# Patient Record
Sex: Female | Born: 1965 | Race: White | Hispanic: No | Marital: Married | Smoking: Never smoker
Health system: Southern US, Community
[De-identification: ages and names within clinical notes are randomized; demographics above are authoritative.]

## PROBLEM LIST (undated history)

## (undated) DIAGNOSIS — S42301A Unspecified fracture of shaft of humerus, right arm, initial encounter for closed fracture: Secondary | ICD-10-CM

## (undated) DIAGNOSIS — G43909 Migraine, unspecified, not intractable, without status migrainosus: Secondary | ICD-10-CM

## (undated) DIAGNOSIS — R140 Abdominal distension (gaseous): Secondary | ICD-10-CM

## (undated) DIAGNOSIS — R42 Dizziness and giddiness: Secondary | ICD-10-CM

## (undated) DIAGNOSIS — S42302A Unspecified fracture of shaft of humerus, left arm, initial encounter for closed fracture: Secondary | ICD-10-CM

## (undated) DIAGNOSIS — R12 Heartburn: Secondary | ICD-10-CM

## (undated) DIAGNOSIS — R109 Unspecified abdominal pain: Secondary | ICD-10-CM

## (undated) DIAGNOSIS — E739 Lactose intolerance, unspecified: Secondary | ICD-10-CM

## (undated) DIAGNOSIS — R630 Anorexia: Secondary | ICD-10-CM

## (undated) DIAGNOSIS — R011 Cardiac murmur, unspecified: Secondary | ICD-10-CM

## (undated) DIAGNOSIS — R110 Nausea: Secondary | ICD-10-CM

## (undated) HISTORY — DX: Nausea: R11.0

## (undated) HISTORY — DX: Anorexia: R63.0

## (undated) HISTORY — PX: CHOLECYSTECTOMY: SHX55

## (undated) HISTORY — DX: Lactose intolerance, unspecified: E73.9

## (undated) HISTORY — DX: Unspecified fracture of shaft of humerus, right arm, initial encounter for closed fracture: S42.301A

## (undated) HISTORY — DX: Cardiac murmur, unspecified: R01.1

## (undated) HISTORY — DX: Unspecified abdominal pain: R10.9

## (undated) HISTORY — PX: LAPAROSCOPY: SHX197

## (undated) HISTORY — DX: Heartburn: R12

## (undated) HISTORY — DX: Dizziness and giddiness: R42

## (undated) HISTORY — DX: Abdominal distension (gaseous): R14.0

## (undated) HISTORY — DX: Unspecified fracture of shaft of humerus, left arm, initial encounter for closed fracture: S42.302A

## (undated) HISTORY — PX: TONSILLECTOMY: SUR1361

## (undated) HISTORY — DX: Migraine, unspecified, not intractable, without status migrainosus: G43.909

## (undated) HISTORY — PX: BREAST BIOPSY: SHX20

---

## 1999-07-14 ENCOUNTER — Encounter: Payer: Self-pay | Admitting: Obstetrics and Gynecology

## 1999-07-14 ENCOUNTER — Encounter: Admission: RE | Admit: 1999-07-14 | Discharge: 1999-07-14 | Payer: Self-pay | Admitting: Obstetrics and Gynecology

## 2000-06-07 ENCOUNTER — Encounter (INDEPENDENT_AMBULATORY_CARE_PROVIDER_SITE_OTHER): Payer: Self-pay

## 2000-06-07 ENCOUNTER — Ambulatory Visit (HOSPITAL_COMMUNITY): Admission: RE | Admit: 2000-06-07 | Discharge: 2000-06-07 | Payer: Self-pay | Admitting: Obstetrics and Gynecology

## 2004-04-24 ENCOUNTER — Emergency Department: Payer: Self-pay | Admitting: Emergency Medicine

## 2008-06-05 ENCOUNTER — Encounter: Admission: RE | Admit: 2008-06-05 | Discharge: 2008-06-05 | Payer: Self-pay | Admitting: Obstetrics and Gynecology

## 2010-03-10 ENCOUNTER — Encounter
Admission: RE | Admit: 2010-03-10 | Discharge: 2010-03-10 | Payer: Self-pay | Source: Home / Self Care | Attending: Obstetrics and Gynecology | Admitting: Obstetrics and Gynecology

## 2010-08-12 NOTE — Op Note (Signed)
[facility]  Patient:    Monique Martin, Monique Martin                          MRN: [id number] Proc. Date: 06/07/00 Attending:  Katherine Roan, M.D.                           Operative Report  PREOPERATIVE DIAGNOSIS:       Pelvic pain and dysmenorrhea.  POSTOPERATIVE DIAGNOSIS:      Focal endometriosis of the pelvis.  OPERATIONS PERFORMED:         1. Laparoscopy with YAG laser fulguration of                                  endometriosis.                               2. Laser uterosacral nerve ablation procedure.                               3. Adhesiolysis.                               4. Hysteroscopy with dilation and curettage.  SURGEON:                      Katherine Roan, M.D.  DESCRIPTION OF PROCEDURE:     The patient was placed in the lithotomy position, prepped and draped in the usual fashion.  A Hulka elevator was inserted into the cervix.  A transverse incision was made in the abdomen and the abdomen was distended with carbon dioxide.  Aspiration infusion technique was utilized to be sure of proper needle placement.  The abdomen was the visualized through a 10/11 trocar in the umbilicus.  The left ovary appeared to be normal, somewhat cystic.  The right ovary was a streak ovary.  Both tubes were normal, somewhat thickened.  There was focal endometriosis anteriorly as well as posteriorly in the cul-de-sac and along the left pelvic side wall.  A second puncture was made for manipulation in the midline using a 5 mm trocar.  The YAG laser was then brought into view at 15 watts of power with a flat tip and the areas of endometriosis were ablated.  A LUNA procedure was performed.  No unusual blood loss occurred.  The adhesions of the colon over the left infundibulopelvic ligament were lysed, as well.  Anteriorly, I lysed the adhesions from the C-section along with focally ablating the endometriosis anteriorly.  No other abnormality was noted.  It should  be noted, at that time, we did do a chromotubation, and both tubes were widely patent to indigo carmine.  Gas was evacuated and the skin was closed with 0 Vicryl and 3-0 Vicryl.  Hemostasis was secured.  I went down below and carefully dilated the cervix and inserted a diagnostic hysteroscope.  The endometrial cavity appeared to be normal.  All the tissue that was removed was sent to the lab for study.  The patient tolerated this procedure well and was sent to the recovery room in good condition. DD:  06/07/00 TD:  06/07/00 Job: [id number]

## 2011-03-07 ENCOUNTER — Ambulatory Visit: Payer: Self-pay | Admitting: Family Medicine

## 2012-01-09 ENCOUNTER — Other Ambulatory Visit: Payer: Self-pay | Admitting: Obstetrics and Gynecology

## 2012-01-09 DIAGNOSIS — Z1231 Encounter for screening mammogram for malignant neoplasm of breast: Secondary | ICD-10-CM

## 2012-01-16 ENCOUNTER — Other Ambulatory Visit: Payer: Self-pay | Admitting: Gastroenterology

## 2012-01-16 ENCOUNTER — Ambulatory Visit
Admission: RE | Admit: 2012-01-16 | Discharge: 2012-01-16 | Disposition: A | Discharge status: Home / Self Care | Payer: 59 | Source: Ambulatory Visit | Attending: Obstetrics and Gynecology | Admitting: Obstetrics and Gynecology

## 2012-01-16 DIAGNOSIS — Z1231 Encounter for screening mammogram for malignant neoplasm of breast: Secondary | ICD-10-CM

## 2012-01-16 DIAGNOSIS — R1013 Epigastric pain: Secondary | ICD-10-CM

## 2012-01-18 ENCOUNTER — Other Ambulatory Visit: Payer: Self-pay | Admitting: Obstetrics and Gynecology

## 2012-01-18 DIAGNOSIS — R928 Other abnormal and inconclusive findings on diagnostic imaging of breast: Secondary | ICD-10-CM

## 2012-01-19 ENCOUNTER — Ambulatory Visit
Admission: RE | Admit: 2012-01-19 | Discharge: 2012-01-19 | Disposition: A | Discharge status: Home / Self Care | Payer: 59 | Source: Ambulatory Visit | Attending: Gastroenterology | Admitting: Gastroenterology

## 2012-01-19 DIAGNOSIS — R1013 Epigastric pain: Secondary | ICD-10-CM

## 2012-01-22 ENCOUNTER — Encounter (INDEPENDENT_AMBULATORY_CARE_PROVIDER_SITE_OTHER): Payer: Self-pay

## 2012-01-23 ENCOUNTER — Ambulatory Visit
Admission: RE | Admit: 2012-01-23 | Discharge: 2012-01-23 | Disposition: A | Discharge status: Home / Self Care | Payer: 59 | Source: Ambulatory Visit | Attending: Obstetrics and Gynecology | Admitting: Obstetrics and Gynecology

## 2012-01-23 DIAGNOSIS — R928 Other abnormal and inconclusive findings on diagnostic imaging of breast: Secondary | ICD-10-CM

## 2012-01-26 ENCOUNTER — Encounter (INDEPENDENT_AMBULATORY_CARE_PROVIDER_SITE_OTHER): Payer: Self-pay

## 2012-01-26 ENCOUNTER — Encounter (INDEPENDENT_AMBULATORY_CARE_PROVIDER_SITE_OTHER): Payer: Self-pay | Admitting: General Surgery

## 2012-01-26 ENCOUNTER — Ambulatory Visit (INDEPENDENT_AMBULATORY_CARE_PROVIDER_SITE_OTHER): Payer: 59 | Admitting: General Surgery

## 2012-01-26 VITALS — BP 120/84 | HR 77 | Temp 97.8°F | Ht 65.0 in | Wt 147.4 lb

## 2012-01-26 DIAGNOSIS — K8010 Calculus of gallbladder with chronic cholecystitis without obstruction: Secondary | ICD-10-CM

## 2012-01-26 DIAGNOSIS — K8066 Calculus of gallbladder and bile duct with acute and chronic cholecystitis without obstruction: Secondary | ICD-10-CM

## 2012-01-26 NOTE — Patient Instructions (Addendum)

## 2012-01-26 NOTE — Assessment & Plan Note (Signed)
Patient with classic symptoms of biliary colic and chronic cholecystitis. She has tenderness in her right upper quadrant and episodic colicky pain consistent with passing stones.  We will plan to do a laparoscopic cholecystectomy with intraoperative cholangiogram.  The surgical procedure was described to the patient in detail.  The patient was given Agricultural engineer. .  I discussed the incision type and location, the location of the gallbladder, the anatomy of the bile ducts and arteries, and the typical progression of surgery.  I discussed the possibility of converting to an open operation.  I advised of the risks of bleeding, infection, damage to other structures (such as the bile duct, intestine or liver), bile leak, need for other procedures or surgeries, and post op diarrhea/constipation.  We discussed the risk of blood clot.  We discussed the recovery period and post operative restrictions.  The patient was advised against taking blood thinners the week before surgery.

## 2012-01-26 NOTE — Progress Notes (Signed)
Chief Complaint  Patient presents with  . Pre-op Exam    eval gallbladder     HISTORY: Patient is a 46 year old female who presents with episodic epigastric pain.  Around 2-3 years ago she had around 8 or 9 months of severe pain. She was seen in urgent care and ruled out for cardiac issues.  No one ever figured out what her pain was and it gradually went away.  She did notice that it occurred after eating and that [place] q. And fried foods to make it worse. The severe attacks lasted around 15 minutes and went away on their own. This past summer she had them come on again and they continued to get a lot worse. She tried antacids which did not help. She tried H2 blockers which helped a little bit but did not relieve the pain. She got to where she was having pain with many different foods despite their fatty content or sugar content. She's lost 14 pounds in the past few weeks because of food avoidance. Labor Day with the last major major attack and she says she has not been able to see anything since that time of any significant quantity. All of the women on her father's side of family have had gallbladder disease.  Past Medical History  Diagnosis Date  . Heart murmur   . Migraines   . Anorexia   . Broken arms     both arms  . Dizziness   . Lactose intolerance   . Heartburn   . Abdominal pain   . Bloating   . Nausea     Past Surgical History  Procedure Date  . Laparoscopy   . Cesarean section 1992&1996    x 2  . Tonsillectomy     No current outpatient prescriptions on file.     No Known Allergies   Family History  Problem Relation Age of Onset  . Cancer Mother 61    bone  . Cancer Paternal Aunt     breast  . Cancer Paternal Uncle     bone   . Cancer Paternal Uncle     leukemia     History   Social History  . Marital Status: Married    Spouse Name: N/A    Number of Children: N/A  . Years of Education: N/A   Social History Main Topics  . Smoking status: Never  Smoker   . Smokeless tobacco: None  . Alcohol Use: No  . Drug Use: No  . Sexually Active:      REVIEW OF SYSTEMS - PERTINENT POSITIVES ONLY: 12 point review of systems negative other than HPI and PMH except for weight loss  EXAM: Filed Vitals:   01/26/12 0950  BP: 120/84  Pulse: 77  Temp: 97.8 F (36.6 C)    Gen:  No acute distress.  Well nourished and well groomed.   Neurological: Alert and oriented to person, place, and time. Coordination normal.  Head: Normocephalic and atraumatic.  Eyes: Conjunctivae are normal. Pupils are equal, round, and reactive to light. No scleral icterus.  Neck: Normal range of motion. Neck supple. No tracheal deviation or thyromegaly present.  Cardiovascular: Normal rate, regular rhythm, normal heart sounds and intact distal pulses.  Exam reveals no gallop and no friction rub.  No murmur heard. Respiratory: Effort normal.  No respiratory distress. No chest wall tenderness. Breath sounds normal.  No wheezes, rales or rhonchi.  GI: Soft. Bowel sounds are normal. The abdomen is soft.  There  is RUQ and epigastric tenderness.  There is no rebound and no guarding.  Musculoskeletal: Normal range of motion. Extremities are nontender.  Lymphadenopathy: No cervical, preauricular, postauricular or axillary adenopathy is present Skin: Skin is warm and dry. No rash noted. No diaphoresis. No erythema. No pallor. No clubbing, cyanosis, or edema.   Psychiatric: Normal mood and affect. Behavior is normal. Judgment and thought content normal.    LABORATORY RESULTS: Available labs are reviewed  AST slightly elevated at 54 Lipase normal.   RADIOLOGY RESULTS: See E-Chart or I-Site for most recent results.  Images and reports are reviewed. US IMPRESSION:  1. Multiple gallstones fill the gallbladder. No pain over  gallbladder.  2. No ductal dilatation.  3. The pancreas is obscured by bowel gas.    ASSESSMENT AND PLAN: Cholelithiasis with acute or chronic  cholecystitis Patient with classic symptoms of biliary colic and chronic cholecystitis. She has tenderness in her right upper quadrant and episodic colicky pain consistent with passing stones.  We will plan to do a laparoscopic cholecystectomy with intraoperative cholangiogram.  The surgical procedure was described to the patient in detail.  The patient was given Agricultural engineer. .  I discussed the incision type and location, the location of the gallbladder, the anatomy of the bile ducts and arteries, and the typical progression of surgery.  I discussed the possibility of converting to an open operation.  I advised of the risks of bleeding, infection, damage to other structures (such as the bile duct, intestine or liver), bile leak, need for other procedures or surgeries, and post op diarrhea/constipation.  We discussed the risk of blood clot.  We discussed the recovery period and post operative restrictions.  The patient was advised against taking blood thinners the week before surgery.          Maudry Diego MD Surgical Oncology, General and Endocrine Surgery [facility]      Visit Diagnoses: 1. Cholelithiasis with acute or chronic cholecystitis     Primary Care Physician: No primary provider on file.

## 2012-02-09 ENCOUNTER — Other Ambulatory Visit (INDEPENDENT_AMBULATORY_CARE_PROVIDER_SITE_OTHER): Payer: Self-pay | Admitting: General Surgery

## 2012-02-09 DIAGNOSIS — K8010 Calculus of gallbladder with chronic cholecystitis without obstruction: Secondary | ICD-10-CM

## 2012-02-09 DIAGNOSIS — K824 Cholesterolosis of gallbladder: Secondary | ICD-10-CM

## 2012-02-12 ENCOUNTER — Telehealth (INDEPENDENT_AMBULATORY_CARE_PROVIDER_SITE_OTHER): Payer: Self-pay

## 2012-02-12 NOTE — Telephone Encounter (Signed)
I called the pt to schedule her a postop appointment.  She is doing well.  I gave her an appointment for 11/25.  She asked when she can get in a bath.  She knows she can shower after 48 hours postop.  I told her to wait 3-4 weeks until after her incisions are healed because she puts them at risk for opening up.

## 2012-02-19 ENCOUNTER — Ambulatory Visit (INDEPENDENT_AMBULATORY_CARE_PROVIDER_SITE_OTHER): Payer: 59 | Admitting: General Surgery

## 2012-02-19 ENCOUNTER — Encounter (INDEPENDENT_AMBULATORY_CARE_PROVIDER_SITE_OTHER): Payer: Self-pay | Admitting: General Surgery

## 2012-02-19 VITALS — BP 132/72 | HR 69 | Temp 98.0°F | Resp 18 | Ht 65.0 in | Wt 150.0 lb

## 2012-02-19 DIAGNOSIS — K8010 Calculus of gallbladder with chronic cholecystitis without obstruction: Secondary | ICD-10-CM

## 2012-02-19 DIAGNOSIS — K8066 Calculus of gallbladder and bile duct with acute and chronic cholecystitis without obstruction: Secondary | ICD-10-CM

## 2012-02-20 NOTE — Assessment & Plan Note (Signed)
Pt feels much better since surgery.  No evidence of surgical complications.  Follow up PRN

## 2012-02-20 NOTE — Progress Notes (Signed)
HISTORY: Pt feels much better since having gallbladder removed.  She has no nausea or vomiting.  She denies fever or chills.  She denies diarrhea or constipation.  She is no longer taking narcotics.      EXAM: General:  Alert and oriented.  Well dressed.   Incision:  Healing well.     PATHOLOGY: Chronic cholecystitis and cholelithiasis.     ASSESSMENT AND PLAN:   Cholelithiasis with acute or chronic cholecystitis Pt feels much better since surgery.  No evidence of surgical complications.  Follow up PRN      Maudry Diego, MD Surgical Oncology, General & Endocrine Surgery [facility]  No primary provider on file. No ref. provider found

## 2012-03-07 ENCOUNTER — Ambulatory Visit: Payer: Self-pay | Admitting: Family Medicine

## 2012-04-11 ENCOUNTER — Encounter (INDEPENDENT_AMBULATORY_CARE_PROVIDER_SITE_OTHER): Payer: Self-pay

## 2013-02-05 ENCOUNTER — Other Ambulatory Visit: Payer: Self-pay

## 2013-02-05 DIAGNOSIS — Z1231 Encounter for screening mammogram for malignant neoplasm of breast: Secondary | ICD-10-CM

## 2013-03-10 ENCOUNTER — Ambulatory Visit
Admission: RE | Admit: 2013-03-10 | Discharge: 2013-03-10 | Disposition: A | Discharge status: Home / Self Care | Payer: 59 | Source: Ambulatory Visit

## 2013-03-10 DIAGNOSIS — Z1231 Encounter for screening mammogram for malignant neoplasm of breast: Secondary | ICD-10-CM

## 2014-02-23 ENCOUNTER — Other Ambulatory Visit: Payer: Self-pay

## 2014-02-23 DIAGNOSIS — Z1231 Encounter for screening mammogram for malignant neoplasm of breast: Secondary | ICD-10-CM

## 2014-03-13 ENCOUNTER — Ambulatory Visit
Admission: RE | Admit: 2014-03-13 | Discharge: 2014-03-13 | Disposition: A | Discharge status: Home / Self Care | Payer: 59 | Source: Ambulatory Visit

## 2014-03-13 DIAGNOSIS — Z1231 Encounter for screening mammogram for malignant neoplasm of breast: Secondary | ICD-10-CM

## 2015-08-26 ENCOUNTER — Other Ambulatory Visit: Payer: Self-pay | Admitting: Obstetrics and Gynecology

## 2015-08-26 DIAGNOSIS — Z1231 Encounter for screening mammogram for malignant neoplasm of breast: Secondary | ICD-10-CM

## 2015-09-13 ENCOUNTER — Ambulatory Visit
Admission: RE | Admit: 2015-09-13 | Discharge: 2015-09-13 | Disposition: A | Discharge status: Home / Self Care | Payer: 59 | Source: Ambulatory Visit | Attending: Obstetrics and Gynecology | Admitting: Obstetrics and Gynecology

## 2015-09-13 DIAGNOSIS — Z1231 Encounter for screening mammogram for malignant neoplasm of breast: Secondary | ICD-10-CM

## 2015-09-20 ENCOUNTER — Other Ambulatory Visit: Payer: Self-pay | Admitting: Obstetrics and Gynecology

## 2015-09-20 DIAGNOSIS — R928 Other abnormal and inconclusive findings on diagnostic imaging of breast: Secondary | ICD-10-CM

## 2015-09-21 ENCOUNTER — Ambulatory Visit
Admission: RE | Admit: 2015-09-21 | Discharge: 2015-09-21 | Disposition: A | Discharge status: Home / Self Care | Payer: 59 | Source: Ambulatory Visit | Attending: Obstetrics and Gynecology | Admitting: Obstetrics and Gynecology

## 2015-09-21 ENCOUNTER — Other Ambulatory Visit: Payer: Self-pay | Admitting: Obstetrics and Gynecology

## 2015-09-21 DIAGNOSIS — R928 Other abnormal and inconclusive findings on diagnostic imaging of breast: Secondary | ICD-10-CM

## 2015-09-23 ENCOUNTER — Other Ambulatory Visit: Payer: 59

## 2015-10-05 ENCOUNTER — Other Ambulatory Visit: Payer: Self-pay | Admitting: Obstetrics and Gynecology

## 2015-10-05 DIAGNOSIS — R928 Other abnormal and inconclusive findings on diagnostic imaging of breast: Secondary | ICD-10-CM

## 2015-10-06 ENCOUNTER — Ambulatory Visit
Admission: RE | Admit: 2015-10-06 | Discharge: 2015-10-06 | Disposition: A | Discharge status: Home / Self Care | Payer: 59 | Source: Ambulatory Visit | Attending: Obstetrics and Gynecology | Admitting: Obstetrics and Gynecology

## 2015-10-06 DIAGNOSIS — R928 Other abnormal and inconclusive findings on diagnostic imaging of breast: Secondary | ICD-10-CM

## 2015-10-06 HISTORY — PX: BREAST BIOPSY: SHX20

## 2016-05-11 ENCOUNTER — Other Ambulatory Visit: Payer: Self-pay | Admitting: Obstetrics and Gynecology

## 2016-05-11 DIAGNOSIS — N6310 Unspecified lump in the right breast, unspecified quadrant: Secondary | ICD-10-CM

## 2016-05-26 ENCOUNTER — Ambulatory Visit
Admission: RE | Admit: 2016-05-26 | Discharge: 2016-05-26 | Disposition: A | Discharge status: Home / Self Care | Payer: 59 | Source: Ambulatory Visit | Attending: Obstetrics and Gynecology | Admitting: Obstetrics and Gynecology

## 2016-05-26 DIAGNOSIS — N6310 Unspecified lump in the right breast, unspecified quadrant: Secondary | ICD-10-CM

## 2016-12-19 ENCOUNTER — Other Ambulatory Visit: Payer: Self-pay | Admitting: Obstetrics and Gynecology

## 2016-12-19 DIAGNOSIS — Z1231 Encounter for screening mammogram for malignant neoplasm of breast: Secondary | ICD-10-CM

## 2016-12-29 ENCOUNTER — Ambulatory Visit
Admission: RE | Admit: 2016-12-29 | Discharge: 2016-12-29 | Disposition: A | Discharge status: Home / Self Care | Payer: 59 | Source: Ambulatory Visit | Attending: Obstetrics and Gynecology | Admitting: Obstetrics and Gynecology

## 2016-12-29 DIAGNOSIS — Z1231 Encounter for screening mammogram for malignant neoplasm of breast: Secondary | ICD-10-CM

## 2018-01-15 ENCOUNTER — Other Ambulatory Visit: Payer: Self-pay | Admitting: Obstetrics and Gynecology

## 2018-01-15 DIAGNOSIS — Z1231 Encounter for screening mammogram for malignant neoplasm of breast: Secondary | ICD-10-CM

## 2018-01-25 ENCOUNTER — Ambulatory Visit: Payer: 59 | Admitting: Family Medicine

## 2018-01-25 ENCOUNTER — Encounter: Payer: Self-pay | Admitting: Family Medicine

## 2018-01-25 VITALS — BP 150/98 | HR 75 | Temp 98.2°F | Ht 65.0 in | Wt 179.4 lb

## 2018-01-25 DIAGNOSIS — Z780 Asymptomatic menopausal state: Secondary | ICD-10-CM

## 2018-01-25 DIAGNOSIS — E559 Vitamin D deficiency, unspecified: Secondary | ICD-10-CM

## 2018-01-25 DIAGNOSIS — R638 Other symptoms and signs concerning food and fluid intake: Secondary | ICD-10-CM | POA: Insufficient documentation

## 2018-01-25 DIAGNOSIS — Z1211 Encounter for screening for malignant neoplasm of colon: Secondary | ICD-10-CM

## 2018-01-25 DIAGNOSIS — Z8349 Family history of other endocrine, nutritional and metabolic diseases: Secondary | ICD-10-CM

## 2018-01-25 DIAGNOSIS — Z114 Encounter for screening for human immunodeficiency virus [HIV]: Secondary | ICD-10-CM | POA: Diagnosis not present

## 2018-01-25 DIAGNOSIS — E538 Deficiency of other specified B group vitamins: Secondary | ICD-10-CM

## 2018-01-25 DIAGNOSIS — J302 Other seasonal allergic rhinitis: Secondary | ICD-10-CM

## 2018-01-25 DIAGNOSIS — R030 Elevated blood-pressure reading, without diagnosis of hypertension: Secondary | ICD-10-CM

## 2018-01-25 DIAGNOSIS — M2550 Pain in unspecified joint: Secondary | ICD-10-CM

## 2018-01-25 LAB — CBC WITH DIFFERENTIAL/PLATELET
BASOS PCT: 1.2 % (ref 0.0–3.0)
Basophils Absolute: 0.1 10*3/uL (ref 0.0–0.1)
EOS PCT: 0.9 % (ref 0.0–5.0)
Eosinophils Absolute: 0.1 10*3/uL (ref 0.0–0.7)
HEMATOCRIT: 44.3 % (ref 36.0–46.0)
Hemoglobin: 14.9 g/dL (ref 12.0–15.0)
LYMPHS ABS: 2.5 10*3/uL (ref 0.7–4.0)
LYMPHS PCT: 38.8 % (ref 12.0–46.0)
MCHC: 33.7 g/dL (ref 30.0–36.0)
MCV: 93.9 fl (ref 78.0–100.0)
MONOS PCT: 5.4 % (ref 3.0–12.0)
Monocytes Absolute: 0.4 10*3/uL (ref 0.1–1.0)
NEUTROS PCT: 53.7 % (ref 43.0–77.0)
Neutro Abs: 3.5 10*3/uL (ref 1.4–7.7)
PLATELETS: 354 10*3/uL (ref 150.0–400.0)
RBC: 4.72 Mil/uL (ref 3.87–5.11)
RDW: 13.5 % (ref 11.5–15.5)
WBC: 6.5 10*3/uL (ref 4.0–10.5)

## 2018-01-25 LAB — FOLLICLE STIMULATING HORMONE: FSH: 79.1 m[IU]/mL

## 2018-01-25 LAB — COMPREHENSIVE METABOLIC PANEL
ALK PHOS: 102 U/L (ref 39–117)
ALT: 58 U/L — ABNORMAL HIGH (ref 0–35)
AST: 40 U/L — ABNORMAL HIGH (ref 0–37)
Albumin: 5.2 g/dL (ref 3.5–5.2)
BILIRUBIN TOTAL: 0.5 mg/dL (ref 0.2–1.2)
BUN: 15 mg/dL (ref 6–23)
CO2: 30 meq/L (ref 19–32)
Calcium: 10.5 mg/dL (ref 8.4–10.5)
Chloride: 99 mEq/L (ref 96–112)
Creatinine, Ser: 0.86 mg/dL (ref 0.40–1.20)
GFR: 73.26 mL/min (ref >60.00)
GLUCOSE: 100 mg/dL — AB (ref 70–99)
POTASSIUM: 4.3 meq/L (ref 3.5–5.1)
Sodium: 140 mEq/L (ref 135–145)
Total Protein: 8.1 g/dL (ref 6.0–8.3)

## 2018-01-25 LAB — LDL CHOLESTEROL, DIRECT: Direct LDL: 172 mg/dL

## 2018-01-25 LAB — LIPID PANEL
Cholesterol: 284 mg/dL — ABNORMAL HIGH (ref 0–200)
HDL: 68.2 mg/dL (ref >39.00)
NONHDL: 215.81
Total CHOL/HDL Ratio: 4
Triglycerides: 257 mg/dL — ABNORMAL HIGH (ref 0.0–149.0)
VLDL: 51.4 mg/dL — AB (ref 0.0–40.0)

## 2018-01-25 LAB — LUTEINIZING HORMONE: LH: 21.64 m[IU]/mL

## 2018-01-25 LAB — B12 AND FOLATE PANEL
FOLATE: 10 ng/mL (ref >5.9)
Vitamin B-12: 201 pg/mL — ABNORMAL LOW (ref 211–911)

## 2018-01-25 LAB — VITAMIN D 25 HYDROXY (VIT D DEFICIENCY, FRACTURES): VITD: 23.62 ng/mL — AB (ref 30.00–100.00)

## 2018-01-25 NOTE — Progress Notes (Addendum)
Subjective:    Patient ID: Monique Martin, female    DOB: [birth year], 52 y.o.   MRN: [id number]  HPI  Patient presents to clinic to establish with PCP.  Patient has not had a PCP in many years.  She does see GYN regularly for her woman's health exams, has upcoming mammogram planned for December 2019 and has upcoming GYN appointment for Pap smear.  Patient has never had colonoscopy, would prefer not to do colonoscopy but is interested in Cologuard.  Patient also reports concerns regarding inability to lose weight.  Patient states ever since she went through menopause at the age of 48, it has been more difficult to keep weight under control.  She has tried different diet plans.  Patient is concerned she possibly has a thyroid issue due to family history of thyroid problems.  Patients past medical, social, surgical and family history reviewed and updated in chart.  Due to patient having strong family history of heart disease, heart attack and diabetes she wants full blood panel drawn today.  Patient also notes her BP has been higher the past few times she has had it checked. States she has never had BP problems before.   Also c/o generalized aches and joint pains. Thinks it is arthritis.   Past Medical History:  Diagnosis Date  . Abdominal pain   . Anorexia   . Bloating   . Broken arms    both arms  . Dizziness   . Heart murmur   . Heartburn   . Lactose intolerance   . Migraines   . Migraines   . Nausea    Social History   Tobacco Use  . Smoking status: Never Smoker  . Smokeless tobacco: Never Used  Substance Use Topics  . Alcohol use: Never    Frequency: Never   Past Surgical History:  Procedure Laterality Date  . BREAST BIOPSY Right 10/06/2015  . BREAST BIOPSY    . CESAREAN SECTION  1992&1996   x 2  . CHOLECYSTECTOMY    . LAPAROSCOPY    . TONSILLECTOMY     Family History  Problem Relation Age of Onset  . Diabetes Father   . Hearing loss Father   . Hyperlipidemia  Father   . Hypertension Father   . Cancer Mother 33       bone  . Cancer Paternal Aunt        breast  . Breast cancer Paternal Aunt   . Cancer Paternal Uncle        bone   . Cancer Paternal Uncle        leukemia  . Arthritis Maternal Grandmother   . Hyperlipidemia Maternal Grandmother   . Hypertension Maternal Grandmother   . Stroke Maternal Grandmother   . Heart disease Maternal Grandfather   . Hyperlipidemia Maternal Grandfather   . Hypertension Maternal Grandfather   . Heart attack Maternal Grandfather   . Stroke Paternal Grandmother   . Hypertension Paternal Grandmother   . Hyperlipidemia Paternal Grandmother   . Heart disease Paternal Grandmother   . Heart attack Paternal Grandmother   . Heart disease Paternal Grandfather   . Hyperlipidemia Paternal Grandfather   . Hypertension Paternal Grandfather   . Heart attack Paternal Grandfather   . Hearing loss Paternal Grandfather    Review of Systems  Constitutional: Negative for chills, fatigue and fever.  HENT: Negative for congestion, ear pain, sinus pain and sore throat.   Eyes: Negative.   Respiratory: Negative for cough,  shortness of breath and wheezing.   Cardiovascular: Negative for chest pain, palpitations and leg swelling.  Gastrointestinal: Negative for abdominal pain, diarrhea, nausea and vomiting.  Genitourinary: Negative for dysuria, frequency and urgency.  Musculoskeletal: Negative for arthralgias and myalgias.  Skin: Negative for color change, pallor and rash.  Neurological: Negative for syncope, light-headedness and headaches.  Psychiatric/Behavioral: The patient is not nervous/anxious.       Objective:   Physical Exam  Constitutional: She is oriented to person, place, and time. No distress.  HENT:  Head: Normocephalic and atraumatic.  Eyes: EOM are normal. No scleral icterus.  Neck: Neck supple. No tracheal deviation present.  Cardiovascular: Normal rate, regular rhythm and normal heart sounds.  No  carotid bruit  Pulmonary/Chest: Effort normal and breath sounds normal. No respiratory distress.  Musculoskeletal: Normal range of motion. She exhibits no edema or deformity.  Neurological: She is alert and oriented to person, place, and time. No cranial nerve deficit.  Skin: Skin is warm and dry. She is not diaphoretic. No erythema. No pallor.  Psychiatric: She has a normal mood and affect. Her behavior is normal.  Nursing note and vitals reviewed.     Vitals:   01/25/18 0858  BP: (!) 150/98  Pulse: 75  Temp: 98.2 F (36.8 C)  SpO2: 96%   Assessment & Plan:   Unable to lose weight/postmenopausal - patient has had more difficult time losing weight since going through menopause.  We included female hormone levels and lab work to further investigate this.  Patient advised to start counting calories and keeping log of what she is eating to help keep track.  Patient advised to download lose it app, this happens very helpful with calorie counting and assisting people reach weight-loss goals.  Also encouraged regular physical activity.  Family history of metabolic/nutritional disorder -- CBC, CMP, lipid level and TSH, Vit D and B12/folic acid ordered and lab work.  HIV screening -HIV screening labs ordered today  Elevated blood pressure reading - patient will keep log of blood pressure at home for the next couple of weeks, when she returns to clinic in a couple weeks for follow-up we will recheck blood pressure.  If continues to trend high patient is aware that most likely we will start her on a low-dose blood pressure medication.  Colon cancer screening - Cologuard order placed for colon cancer screening.  Arthralgias -- Can try aleve BID as needed for joint pains. Also regular exercise can help reduce joint pains.    Follow-up in 2 to 3 weeks for monitoring of blood pressure.

## 2018-01-30 MED ORDER — VITAMIN D3 50 MCG (2000 UT) PO CAPS: 2000.0000 [IU] | ORAL_CAPSULE | Freq: Every day | ORAL | 0 refills | Status: AC

## 2018-01-30 MED ORDER — B-12 1000 MCG PO TABS: 1.0000 | ORAL_TABLET | Freq: Every day | ORAL | 0 refills | Status: AC

## 2018-01-30 NOTE — Addendum Note (Signed)
Addended by: Philis Nettle on: 01/30/2018 09:29 AM   Modules accepted: Orders

## 2018-01-31 LAB — THYROID PANEL WITH TSH
Free Thyroxine Index: 3 (ref 1.4–3.8)
T3 UPTAKE: 26 % (ref 22–35)
T4, Total: 11.5 ug/dL (ref 5.1–11.9)
TSH: 0.74 m[IU]/L

## 2018-01-31 LAB — HIV ANTIBODY (ROUTINE TESTING W REFLEX): HIV: NONREACTIVE

## 2018-01-31 LAB — PROGESTERONE

## 2018-01-31 LAB — ESTROGENS, TOTAL: Estrogen: 197 pg/mL

## 2018-02-08 ENCOUNTER — Ambulatory Visit: Payer: 59 | Admitting: Family Medicine

## 2018-02-15 ENCOUNTER — Encounter: Payer: Self-pay | Admitting: Family Medicine

## 2018-02-15 ENCOUNTER — Ambulatory Visit: Payer: 59 | Admitting: Family Medicine

## 2018-02-15 VITALS — BP 118/82 | HR 85 | Temp 98.2°F | Ht 65.0 in | Wt 175.0 lb

## 2018-02-15 DIAGNOSIS — R638 Other symptoms and signs concerning food and fluid intake: Secondary | ICD-10-CM | POA: Diagnosis not present

## 2018-02-15 DIAGNOSIS — E785 Hyperlipidemia, unspecified: Secondary | ICD-10-CM

## 2018-02-15 DIAGNOSIS — E559 Vitamin D deficiency, unspecified: Secondary | ICD-10-CM | POA: Diagnosis not present

## 2018-02-15 DIAGNOSIS — E538 Deficiency of other specified B group vitamins: Secondary | ICD-10-CM | POA: Diagnosis not present

## 2018-02-15 DIAGNOSIS — R030 Elevated blood-pressure reading, without diagnosis of hypertension: Secondary | ICD-10-CM

## 2018-02-15 NOTE — Addendum Note (Signed)
Addended by: Philis Nettle on: 02/15/2018 09:42 AM   Modules accepted: Level of Service

## 2018-02-15 NOTE — Progress Notes (Signed)
Subjective:    Patient ID: Monique Martin, female    DOB: 1965/11/27, 52 y.o.   MRN: [id number]  HPI  Presents to clinic to follow up on elevated BP readings, discuss elevated lipids. She has a stong family history of heart disease so we are monitoring closely.  Patient states her lab work and blood pressure reading last visit open her eyes to the fact that she really needs to start working much harder on diet and exercise.  She has cut back on her bread intake, limiting her salt, cut back on caffeine and sugar, has started drinking hibiscus tea rather than sweet tea.  She is also working on walking more and being more active.  Patient would like to avoid getting on medication for blood pressure and cholesterol if she can, but understands that if diet and exercise are not effective she may need medication in the future.  Patient has also started taking vitamin D supplement and vitamin B12 supplement since B12 and D deficiencies were discovered and lab work.  She is been tolerating these medications well.   Patient Active Problem List   Diagnosis Date Noted  . Unable to lose weight 01/25/2018  . Family history of metabolic and nutritional disorder 01/25/2018  . Post-menopausal 01/25/2018   Social History   Tobacco Use  . Smoking status: Never Smoker  . Smokeless tobacco: Never Used  Substance Use Topics  . Alcohol use: Never    Frequency: Never   Review of Systems  Constitutional: Negative for chills, fatigue and fever.  HENT: Negative for congestion, ear pain, sinus pain and sore throat.   Eyes: Negative.   Respiratory: Negative for cough, shortness of breath and wheezing.   Cardiovascular: Negative for chest pain, palpitations and leg swelling.  Gastrointestinal: Negative for abdominal pain, diarrhea, nausea and vomiting.  Genitourinary: Negative for dysuria, frequency and urgency.  Musculoskeletal: Negative for arthralgias and myalgias.  Skin: Negative for color change,  pallor and rash.  Neurological: Negative for syncope, light-headedness and headaches.  Psychiatric/Behavioral: The patient is not nervous/anxious.       Objective:   Physical Exam  Constitutional:  She appears well-developed and well-nourished. No distress.  HENT:  Head: Normocephalic and atraumatic.  Eyes: Pupils are equal, round, and reactive to light. EOM are normal. No scleral icterus.  Neck: Normal range of motion. Neck supple. No tracheal deviation present.  Cardiovascular: Normal rate, regular rhythm and normal heart sounds. No LE edema. No carotid bruit.  Pulmonary/Chest: Effort normal and breath sounds normal. No respiratory distress. She has no wheezes. She has no rales.  Neurological: She is alert and oriented to person, place, and time.  Gait normal  Skin: Skin is warm and dry. No pallor.  Psychiatric: She has a normal mood and affect. Her behavior is normal. Thought content normal.   Nursing note and vitals reviewed.    Vitals:   02/15/18 1305  BP: 118/82  Pulse: 85  Temp: 98.2 F (36.8 C)  SpO2: 97%   BP Readings from Last 3 Encounters:  02/15/18 118/82  01/25/18 (!) 150/98  02/19/12 132/72   Wt Readings from Last 3 Encounters:  02/15/18 175 lb (79.4 kg)  01/25/18 179 lb 6.4 oz (81.4 kg)  02/19/12 150 lb (68 kg)    Assessment & Plan:   Elevated BP without diagnosis of hypertension-patient blood pressure is much better today.  She will continue working on healthy diet regular exercise and monitor blood pressures at home.  Hyperlipidemia-patient would  prefer not to start medication at this time, she will continue working on her healthy diet exercise and will follow up with new cholesterol lab work in about 3 months.  Unable to lose weight-patient has been working hard on improving her diet choices and getting more active.  She has lost 4 pounds since last visit.  Vitamin D and B12 deficiency-patient is tolerating supplements very well.  We will recheck  vitamin D and B12 blood work at her next follow-up visit.  Follow-up here in approximately 3 months for management of chronic conditions continued monitoring of blood pressure, cholesterol and weight.  Patient aware she can return to clinic sooner if any issues arise.

## 2018-02-25 ENCOUNTER — Ambulatory Visit
Admission: RE | Admit: 2018-02-25 | Discharge: 2018-02-25 | Disposition: A | Discharge status: Home / Self Care | Payer: 59 | Source: Ambulatory Visit | Attending: Obstetrics and Gynecology | Admitting: Obstetrics and Gynecology

## 2018-02-25 DIAGNOSIS — Z1231 Encounter for screening mammogram for malignant neoplasm of breast: Secondary | ICD-10-CM

## 2018-04-02 ENCOUNTER — Ambulatory Visit: Payer: Self-pay | Admitting: *Deleted

## 2018-04-02 NOTE — Telephone Encounter (Signed)
Returned call to pt who states she removed a engorged tick from the top of her pubic hair on Monday but does not know how long the tick was attached.Pt states that the only way she need it was there is because she began to itch in that area. Pt states that she did walk in the woods around [place] time in the woods when the weather was warmer. Pt states she does not have a bulls eye rash to the area but states that the area is red like a mosquito bite. No other symptoms noted at this time. Pt given home care advice and advised to return call to the office with worsening symptoms. Pt verbalized understanding.  Reason for Disposition . Tick bite with no complications  Answer Assessment - Initial Assessment Questions 1. TYPE of TICK: "Is it a wood tick or a deer tick?" If unsure, ask: "What size was the tick?" "Did it look more like a watermelon seed or a poppy seed?"      Was engorged, 1/2 the size of watermelon seed  2. LOCATION: "Where is the tick bite located?"      Top of pubic hair 3. ONSET: "How long do you think the tick was attached before you removed it?" (Hours or days)      Was removed Monday, has no idea how long it was up there. Pt states that she started to itch which is how she knew it was there. Pt states she was walking in the woods around [place] time 4. TETANUS: "When was the last tetanus booster?"      No 5. PREGNANCY: "Is there any chance you are pregnant?" "When was your last menstrual period?"     No, no cycles since mid 40s  Protocols used: TICK BITE-A-AH

## 2018-05-24 ENCOUNTER — Ambulatory Visit: Payer: 59 | Admitting: Family Medicine

## 2018-06-14 ENCOUNTER — Ambulatory Visit: Payer: 59 | Admitting: Family Medicine

## 2018-07-23 ENCOUNTER — Ambulatory Visit: Payer: 59 | Admitting: Family Medicine

## 2018-12-30 ENCOUNTER — Other Ambulatory Visit: Payer: Self-pay | Admitting: Obstetrics and Gynecology

## 2018-12-30 DIAGNOSIS — Z1231 Encounter for screening mammogram for malignant neoplasm of breast: Secondary | ICD-10-CM

## 2019-02-28 ENCOUNTER — Other Ambulatory Visit: Payer: Self-pay

## 2019-02-28 ENCOUNTER — Ambulatory Visit
Admission: RE | Admit: 2019-02-28 | Discharge: 2019-02-28 | Disposition: A | Discharge status: Home / Self Care | Payer: 59 | Source: Ambulatory Visit | Attending: Obstetrics and Gynecology | Admitting: Obstetrics and Gynecology

## 2019-02-28 DIAGNOSIS — Z1231 Encounter for screening mammogram for malignant neoplasm of breast: Secondary | ICD-10-CM

## 2019-03-05 IMAGING — MG DIGITAL SCREENING BILATERAL MAMMOGRAM WITH TOMO AND CAD
4 series · 4 of 16 positions shown · non-contrast
Comparison: Previous exam(s).

CLINICAL DATA: Screening.

EXAM:
DIGITAL SCREENING BILATERAL MAMMOGRAM WITH TOMO AND CAD

[R CC synth-2D]
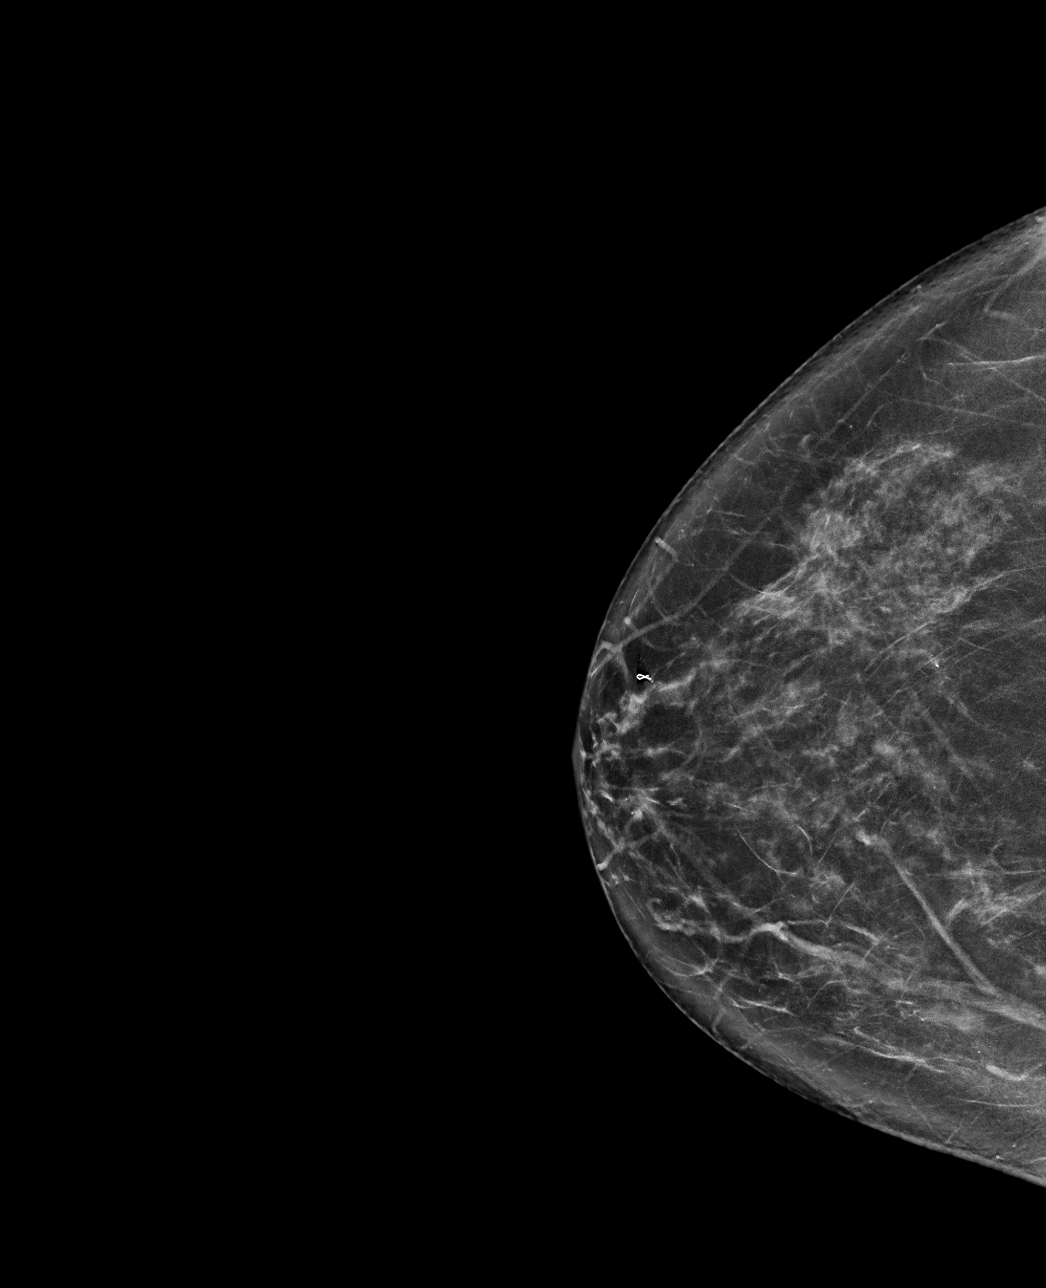

[L MLO tomo · tomo slice 42/83.0]
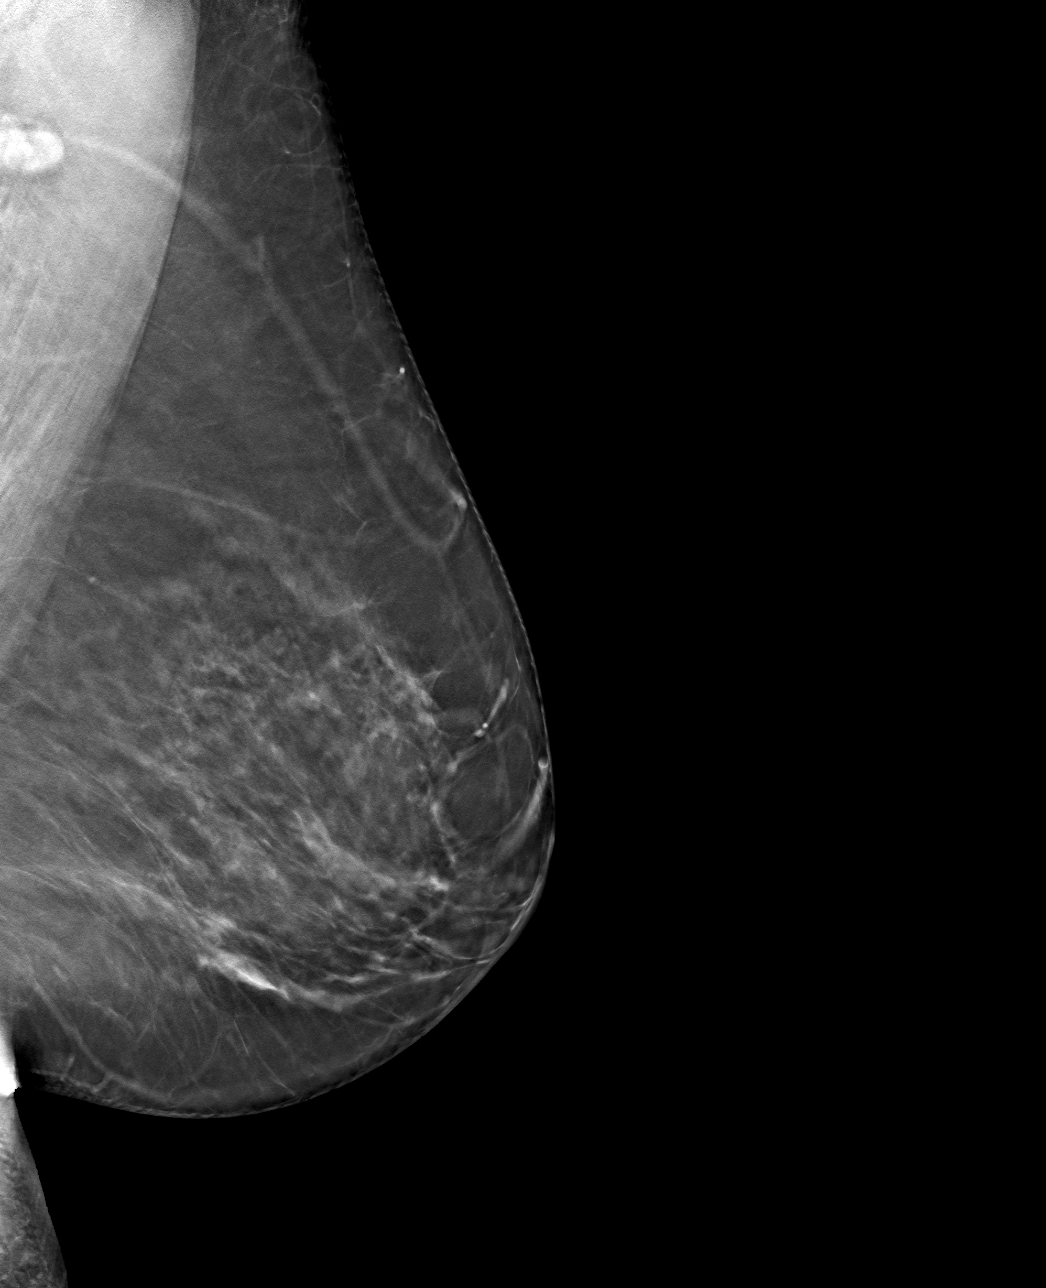

[R MLO tomo · tomo slice 42/83.0]
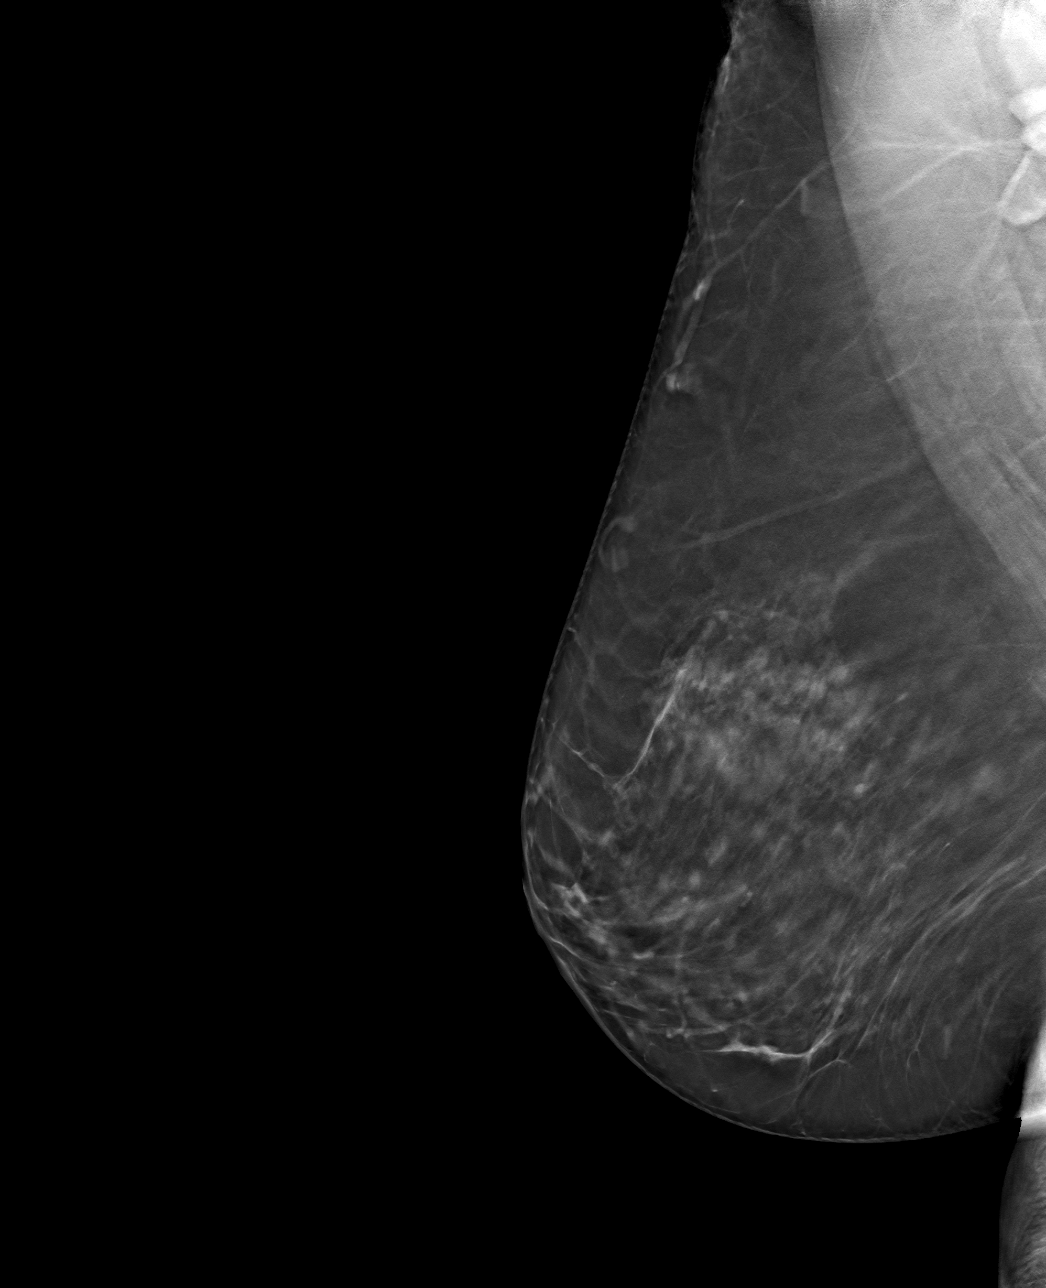

[L CC tomo · tomo slice 41/81.0]
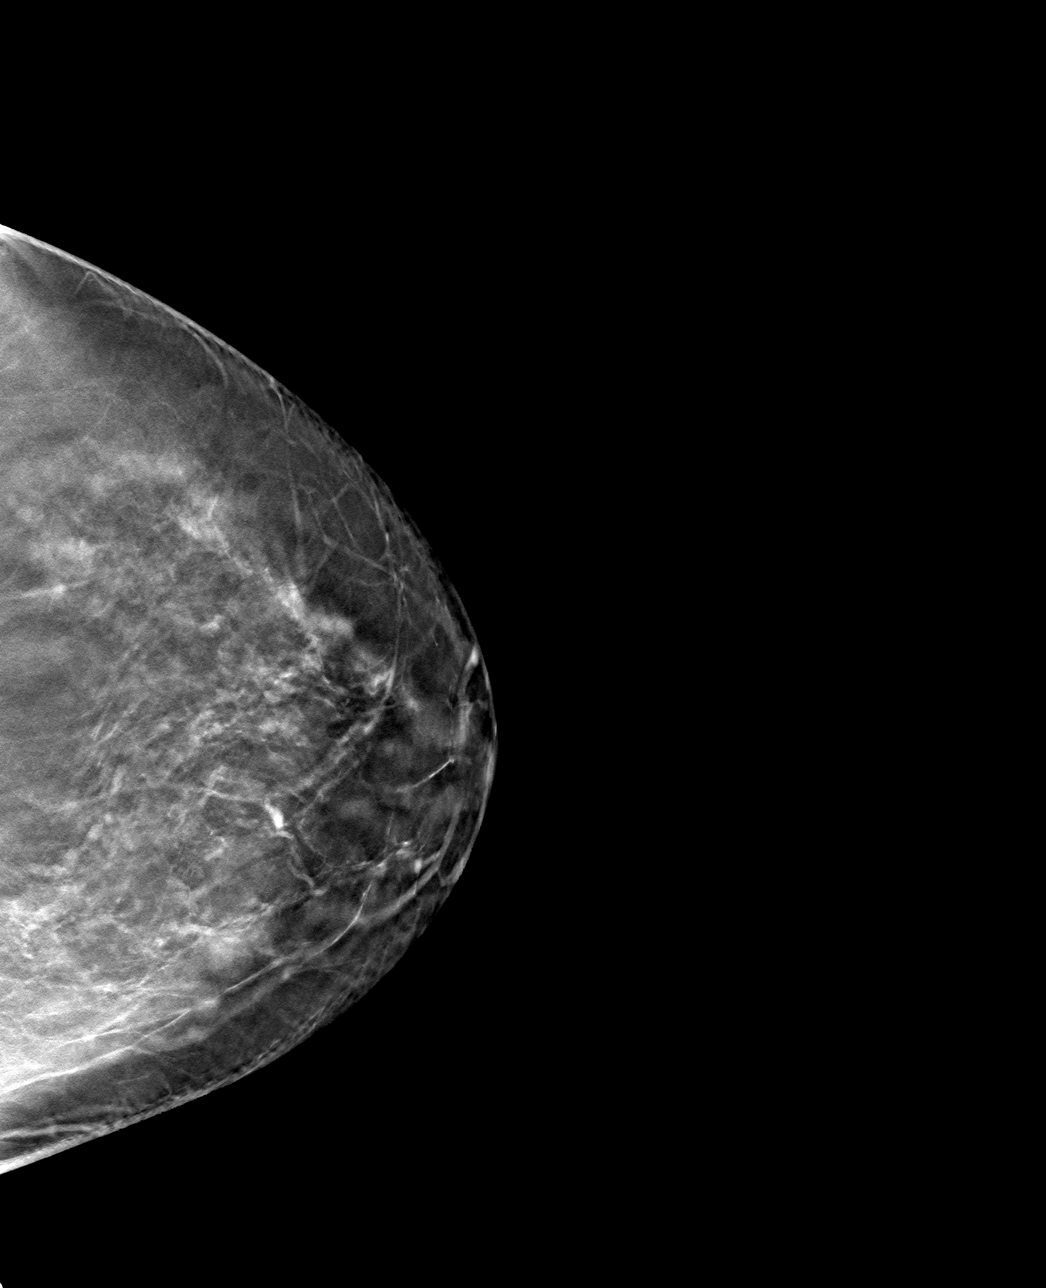

[4 of 16 positions shown; findings below may reference images not displayed]

ACR Breast Density Category b: There are scattered areas of
fibroglandular density.
FINDINGS: There are no findings suspicious for malignancy. Images were
processed with CAD.
IMPRESSION: No mammographic evidence of malignancy. A result letter of this
screening mammogram will be mailed directly to the patient.

RECOMMENDATION:
Screening mammogram in one year. (Code:[id number])

BI-RADS CATEGORY  1: Negative.

## 2020-02-23 ENCOUNTER — Other Ambulatory Visit: Payer: Self-pay | Admitting: Obstetrics and Gynecology

## 2020-02-23 DIAGNOSIS — Z1231 Encounter for screening mammogram for malignant neoplasm of breast: Secondary | ICD-10-CM

## 2020-03-03 ENCOUNTER — Other Ambulatory Visit: Payer: Self-pay

## 2020-03-03 ENCOUNTER — Ambulatory Visit
Admission: RE | Admit: 2020-03-03 | Discharge: 2020-03-03 | Disposition: A | Discharge status: Home / Self Care | Payer: 59 | Source: Ambulatory Visit | Attending: Obstetrics and Gynecology | Admitting: Obstetrics and Gynecology

## 2020-03-03 DIAGNOSIS — Z1231 Encounter for screening mammogram for malignant neoplasm of breast: Secondary | ICD-10-CM

## 2020-03-11 ENCOUNTER — Encounter: Payer: Self-pay | Admitting: Family Medicine

## 2020-03-11 ENCOUNTER — Other Ambulatory Visit: Payer: Self-pay

## 2020-03-11 ENCOUNTER — Ambulatory Visit (INDEPENDENT_AMBULATORY_CARE_PROVIDER_SITE_OTHER): Payer: 59 | Admitting: Family Medicine

## 2020-03-11 VITALS — BP 118/80 | HR 73 | Temp 98.0°F | Ht 65.75 in | Wt 176.4 lb

## 2020-03-11 DIAGNOSIS — R1013 Epigastric pain: Secondary | ICD-10-CM

## 2020-03-11 DIAGNOSIS — Z1211 Encounter for screening for malignant neoplasm of colon: Secondary | ICD-10-CM

## 2020-03-11 DIAGNOSIS — R109 Unspecified abdominal pain: Secondary | ICD-10-CM

## 2020-03-11 DIAGNOSIS — E860 Dehydration: Secondary | ICD-10-CM

## 2020-03-11 LAB — CBC WITH DIFFERENTIAL/PLATELET
Basophils Absolute: 0 10*3/uL (ref 0.0–0.1)
Basophils Relative: 0.4 % (ref 0.0–3.0)
Eosinophils Absolute: 0.1 10*3/uL (ref 0.0–0.7)
Eosinophils Relative: 1 % (ref 0.0–5.0)
HCT: 43.1 % (ref 36.0–46.0)
Hemoglobin: 14.8 g/dL (ref 12.0–15.0)
Lymphocytes Relative: 40.8 % (ref 12.0–46.0)
Lymphs Abs: 2.9 10*3/uL (ref 0.7–4.0)
MCHC: 34.3 g/dL (ref 30.0–36.0)
MCV: 91.8 fl (ref 78.0–100.0)
Monocytes Absolute: 0.4 10*3/uL (ref 0.1–1.0)
Monocytes Relative: 5.2 % (ref 3.0–12.0)
Neutro Abs: 3.7 10*3/uL (ref 1.4–7.7)
Neutrophils Relative %: 52.6 % (ref 43.0–77.0)
Platelets: 319 10*3/uL (ref 150.0–400.0)
RBC: 4.69 Mil/uL (ref 3.87–5.11)
RDW: 13.3 % (ref 11.5–15.5)
WBC: 7 10*3/uL (ref 4.0–10.5)

## 2020-03-11 LAB — BASIC METABOLIC PANEL
BUN: 13 mg/dL (ref 6–23)
CO2: 28 mEq/L (ref 19–32)
Calcium: 10.1 mg/dL (ref 8.4–10.5)
Chloride: 102 mEq/L (ref 96–112)
Creatinine, Ser: 0.91 mg/dL (ref 0.40–1.20)
GFR: 71.04 mL/min (ref >60.00)
Glucose, Bld: 97 mg/dL (ref 70–99)
Potassium: 4.4 mEq/L (ref 3.5–5.1)
Sodium: 139 mEq/L (ref 135–145)

## 2020-03-11 LAB — POCT URINALYSIS DIP (CLINITEK)
Bilirubin, UA: NEGATIVE
Blood, UA: NEGATIVE
Glucose, UA: 100 mg/dL — AB
Nitrite, UA: NEGATIVE
Spec Grav, UA: >=1.03 — AB (ref 1.010–1.025)
Urobilinogen, UA: 0.2 E.U./dL
pH, UA: 6 (ref 5.0–8.0)

## 2020-03-11 LAB — URINALYSIS, MICROSCOPIC ONLY

## 2020-03-11 LAB — HEPATIC FUNCTION PANEL
ALT: 42 U/L — ABNORMAL HIGH (ref 0–35)
AST: 29 U/L (ref 0–37)
Albumin: 5 g/dL (ref 3.5–5.2)
Alkaline Phosphatase: 97 U/L (ref 39–117)
Bilirubin, Direct: 0.1 mg/dL (ref 0.0–0.3)
Total Bilirubin: 0.4 mg/dL (ref 0.2–1.2)
Total Protein: 8.3 g/dL (ref 6.0–8.3)

## 2020-03-11 LAB — H. PYLORI ANTIBODY, IGG: H Pylori IgG: NEGATIVE

## 2020-03-11 LAB — LIPASE: Lipase: 17 U/L (ref 11.0–59.0)

## 2020-03-11 NOTE — Progress Notes (Addendum)
Monique Basnett T. Vianna Venezia, MD, [place] at [facility] [name], [address]  Phone: [phone]  FAX: [phone]  Monique Martin - 54 y.o. female  MRN [id number]  Date of Birth: 1966-03-18  Date: 03/11/2020  PCP: Monique Green, FNP  Referral: Monique Green, FNP  Chief Complaint  Patient presents with  . Abdominal Pain    Started Friday and progressively got worse. Stabbing pain that radiates from L side of back to L rib cage. Nauseated when she eats.      This visit occurred during the SARS-CoV-2 public health emergency.  Safety protocols were in place, including screening questions prior to the visit, additional usage of staff PPE, and extensive cleaning of exam room while observing appropriate contact time as indicated for disinfecting solutions.   Subjective:   Monique Martin is a 54 y.o. very pleasant female patient with Body mass index is 28.69 kg/m. who presents with the following:  Started last Friday and started as a dull pain in the left side.  Now will be in the lower L abdomen.  Will having a stabbing pain.  Has had some other issues in the last three or four months.   She is a very nice young lady at age 82.  She has been having some issues off and on with her upper and left upper abdomen for approximately 6 months.  This is been off and on however since Friday this is been relatively worse compared to her prior episodes.  She is having some issues and pain underneath her rib cage and to a lesser extent on the flank.  She has not had any trauma whatsoever.  She has not lost any weight.  She has not had any bright red blood per rectum or melena.  She is eating without any nausea, vomiting or diarrhea.  She does have a history and is status post cholecystectomy as well as laparoscopy for endometriosis and C-section x2.  She denies any right lower quadrant tenderness or  right upper quadrant tenderness.  To a lesser extent she does have some epigastric tenderness.  Wt Readings from Last 3 Encounters:  03/11/20 176 lb 6.4 oz (80 kg)  02/15/18 175 lb (79.4 kg)  01/25/18 179 lb 6.4 oz (81.4 kg)     Review of Systems is noted in the HPI, as appropriate  Patient Active Problem List   Diagnosis Date Noted  . Hyperlipidemia 02/15/2018  . B12 deficiency 02/15/2018  . Vitamin D deficiency 02/15/2018  . Elevated BP without diagnosis of hypertension 02/15/2018  . Unable to lose weight 01/25/2018  . Family history of metabolic and nutritional disorder 01/25/2018  . Post-menopausal 01/25/2018    Past Medical History:  Diagnosis Date  . Abdominal pain   . Anorexia   . Bloating   . Broken arms    both arms  . Dizziness   . Heart murmur   . Heartburn   . Lactose intolerance   . Migraines   . Migraines   . Nausea     Past Surgical History:  Procedure Laterality Date  . BREAST BIOPSY Right 10/06/2015  . BREAST BIOPSY    . CESAREAN SECTION  1992&1996   x 2  . CHOLECYSTECTOMY    . LAPAROSCOPY    . TONSILLECTOMY      Family History  Problem Relation Age of Onset  . Diabetes Father   .  Hearing loss Father   . Hyperlipidemia Father   . Hypertension Father   . Cancer Mother 54       bone  . Cancer Paternal Aunt        breast  . Breast cancer Paternal Aunt   . Cancer Paternal Uncle        bone   . Cancer Paternal Uncle        leukemia  . Arthritis Maternal Grandmother   . Hyperlipidemia Maternal Grandmother   . Hypertension Maternal Grandmother   . Stroke Maternal Grandmother   . Heart disease Maternal Grandfather   . Hyperlipidemia Maternal Grandfather   . Hypertension Maternal Grandfather   . Heart attack Maternal Grandfather   . Stroke Paternal Grandmother   . Hypertension Paternal Grandmother   . Hyperlipidemia Paternal Grandmother   . Heart disease Paternal Grandmother   . Heart attack Paternal Grandmother   . Heart disease  Paternal Grandfather   . Hyperlipidemia Paternal Grandfather   . Hypertension Paternal Grandfather   . Heart attack Paternal Grandfather   . Hearing loss Paternal Grandfather      Objective:   BP 118/80 (BP Location: Left Arm, Patient Position: Sitting)   Pulse 73   Temp 98 F (36.7 C)   Ht 5' 5.75" (1.67 m)   Wt 176 lb 6.4 oz (80 kg)   SpO2 98%   BMI 28.69 kg/m   GEN: no acute distress. HEENT: Atraumatic, Normocephalic.  Ears and Nose: No external deformity. CV: RRR, No M/G/R. No JVD. No thrill. No extra heart sounds. PULM: CTA B, no wheezes, crackles, rhonchi. No retractions. No resp. distress. No accessory muscle use. ABD: S, ND, +BS. No rebound. No HSM.  Underneath the left rib cage patient is tender to palpation.  She also has some mild tenderness in the epigastric region and to a much lesser extent the left lower quadrant.    She does not have any CVA tenderness.  Full rib exam is done and she does not have any rib tenderness or tenderness with compression of the ribs.  Resisted abdominal concentric and eccentric lifts of rectus as well as oblique musculature causes no discomfort.  She does not have any rebound tenderness.  There is no fluid wave.  EXTR: No c/c/e PSYCH: Normally interactive. Conversant.    Laboratory and Imaging Data: Results for orders placed or performed in visit on 03/11/20  CBC with Differential/Platelet  Result Value Ref Range   WBC 7.0 4.0 - 10.5 K/uL   RBC 4.69 3.87 - 5.11 Mil/uL   Hemoglobin 14.8 12.0 - 15.0 g/dL   HCT 43.1 36.0 - 46.0 %   MCV 91.8 78.0 - 100.0 fl   MCHC 34.3 30.0 - 36.0 g/dL   RDW 13.3 11.5 - 15.5 %   Platelets 319.0 150.0 - 400.0 K/uL   Neutrophils Relative % 52.6 43.0 - 77.0 %   Lymphocytes Relative 40.8 12.0 - 46.0 %   Monocytes Relative 5.2 3.0 - 12.0 %   Eosinophils Relative 1.0 0.0 - 5.0 %   Basophils Relative 0.4 0.0 - 3.0 %   Neutro Abs 3.7 1.4 - 7.7 K/uL   Lymphs Abs 2.9 0.7 - 4.0 K/uL   Monocytes  Absolute 0.4 0.1 - 1.0 K/uL   Eosinophils Absolute 0.1 0.0 - 0.7 K/uL   Basophils Absolute 0.0 0.0 - 0.1 K/uL  Basic metabolic panel  Result Value Ref Range   Sodium 139 135 - 145 mEq/L   Potassium 4.4 3.5 -  5.1 mEq/L   Chloride 102 96 - 112 mEq/L   CO2 28 19 - 32 mEq/L   Glucose, Bld 97 70 - 99 mg/dL   BUN 13 6 - 23 mg/dL   Creatinine, Ser 0.91 0.40 - 1.20 mg/dL   GFR 71.04 >60.00 mL/min   Calcium 10.1 8.4 - 10.5 mg/dL  Hepatic function panel  Result Value Ref Range   Total Bilirubin 0.4 0.2 - 1.2 mg/dL   Bilirubin, Direct 0.1 0.0 - 0.3 mg/dL   Alkaline Phosphatase 97 39 - 117 U/L   AST 29 0 - 37 U/L   ALT 42 (H) 0 - 35 U/L   Total Protein 8.3 6.0 - 8.3 g/dL   Albumin 5.0 3.5 - 5.2 g/dL  H. pylori antibody, IgG  Result Value Ref Range   H Pylori IgG Negative Negative  Lipase  Result Value Ref Range   Lipase 17.0 11.0 - 59.0 U/L  Urine Microscopic  Result Value Ref Range   WBC, UA 3-6/hpf (A) 0-2/hpf   RBC / HPF 0-2/hpf 0-2/hpf   Mucus, UA Presence of (A) None   Squamous Epithelial / LPF Rare(0-4/hpf) Rare(0-4/hpf)  POCT URINALYSIS DIP (CLINITEK)  Result Value Ref Range   Color, UA yellow yellow   Clarity, UA clear clear   Glucose, UA =100 (A) negative mg/dL   Bilirubin, UA negative negative   Ketones, POC UA trace (5) (A) negative mg/dL   Spec Grav, UA >=1.030 (A) 1.010 - 1.025   Blood, UA negative negative   pH, UA 6.0 5.0 - 8.0   POC PROTEIN,UA trace negative, trace   Urobilinogen, UA 0.2 0.2 or 1.0 E.U./dL   Nitrite, UA Negative Negative   Leukocytes, UA Small (1+) (A) Negative     Assessment and Plan:     ICD-10-CM   1. Left lateral abdominal pain  R10.9 CBC with Differential/Platelet    Basic metabolic panel    Hepatic function panel    H. pylori antibody, IgG    Lipase    CT Abdomen Pelvis W Contrast    POCT URINALYSIS DIP (CLINITEK)    Urine Microscopic    Urine Culture    Ambulatory referral to Gastroenterology  2. Epigastric abdominal pain   R10.13 CBC with Differential/Platelet    Basic metabolic panel    Hepatic function panel    H. pylori antibody, IgG    Lipase    CT Abdomen Pelvis W Contrast    Ambulatory referral to Gastroenterology  3. Dehydration  E86.0 Ambulatory referral to Gastroenterology  4. Colon cancer screening  Z12.11 Ambulatory referral to Gastroenterology   Left lateral abdominal pain x6 months with some worsening since Friday.  She is able to eat and drink without much significant impairment.  She is dehydrated currently and does have ketones in her urine.  She also has a small number of white blood cells, so we will do a urine culture as well.  All of her lab work has returned and is grossly unremarkable and is reassuring.  This is not musculoskeletal pain.  Given 33-month in length with several exacerbations worse now compared to prior, obtain a CT of the abdomen and pelvis with contrast to evaluate for potential intra-abdominal pathology, potential renal pathology, possible diverticulitis, and although much less likely, neoplasm cannot fully be excluded.  I have asked our clinical care coordinators to help do this by Friday.  Addendum: 03/12/20 1:50 PM CT is reassuring and labwork is reassuring. +WBC on micro with LE on  UA Etiology is unclear.  I am going to treat with augmentin.  Colitis / diverticulitis not seen on CT.  Possible but less likely.  She has never had a colonoscopy.  I think it is most appropriate for her to see GI.  CT Abdomen Pelvis W Contrast  Result Date: 03/12/2020 CLINICAL DATA:  Left-sided abdominal pain and nausea EXAM: CT ABDOMEN AND PELVIS WITH CONTRAST TECHNIQUE: Multidetector CT imaging of the abdomen and pelvis was performed using the standard protocol following bolus administration of intravenous contrast. Oral contrast also administered. CONTRAST:  188mL OMNIPAQUE IOHEXOL 300 MG/ML  SOLN COMPARISON:  March 07, 2011 FINDINGS: Lower chest: Lung bases are clear.  There is a  small hiatal hernia. Hepatobiliary: There is hepatic steatosis. No focal liver lesions are evident. The gallbladder is absent. There is no appreciable biliary duct dilatation. Pancreas: There is no pancreatic mass or inflammatory focus. Spleen: No splenic lesions are evident. Adrenals/Urinary Tract: Adrenals bilaterally appear normal. There is a parapelvic cyst in the left kidney measuring 1.8 x 1.3 cm. There is no hydronephrosis on either side. There is a 1 mm calculus in the mid right kidney. There is no appreciable ureteral calculus on either side. The urinary bladder is midline with wall thickness within normal limits. Stomach/Bowel: There is moderate stool in the colon. There is no appreciable bowel wall or mesenteric thickening. The terminal ileum appears unremarkable. No bowel obstruction. No evident free air or portal venous air. Vascular/Lymphatic: No abdominal aortic aneurysm. No arterial vascular lesions are evident. Major venous structures appear patent. There is no evident adenopathy in the abdomen or pelvis. Reproductive: The uterus is anteverted. There is no evident adnexal mass. Other: Diminutive appendix without inflammation. No abscess or ascites evident in the abdomen or pelvis. Musculoskeletal: No blastic or lytic bone lesions. No intramuscular or abdominal wall lesions. IMPRESSION: 1. No bowel wall thickening or bowel obstruction. No abscess in the abdomen or pelvis. No appreciable diverticular disease. No periappendiceal region inflammation. 2. Nonobstructing 1 mm calculus mid right kidney. No hydronephrosis or ureteral calculus on either side. Urinary bladder wall thickness normal. 3.  Small hiatal hernia. 4.  Hepatic steatosis. 5.  Gallbladder absent. Electronically Signed   By: Lowella Grip III M.D.   On: 03/12/2020 11:37    Orders Placed This Encounter  Procedures  . Urine Culture  . CT Abdomen Pelvis W Contrast  . CBC with Differential/Platelet  . Basic metabolic panel  .  Hepatic function panel  . H. pylori antibody, IgG  . Lipase  . Urine Microscopic  . Ambulatory referral to Gastroenterology  . POCT URINALYSIS DIP (CLINITEK)    Follow-up: No follow-ups on file.  Signed,  Maud Deed. Norris Bodley, MD   Outpatient Encounter Medications as of 03/11/2020  Medication Sig  . Cholecalciferol (VITAMIN D3) 50 MCG (2000 UT) capsule Take 1 capsule (2,000 Units total) by mouth daily.  Marland Kitchen dextromethorphan-guaiFENesin (MUCINEX DM) 30-600 MG 12hr tablet Take 1 tablet by mouth 2 (two) times daily.  . fluticasone (FLONASE) 50 MCG/ACT nasal spray Place into both nostrils daily.  Marland Kitchen amoxicillin-clavulanate (AUGMENTIN) 875-125 MG tablet Take 1 tablet by mouth 2 (two) times daily for 10 days.  . Cyanocobalamin (B-12) 1000 MCG TABS Take 1 tablet by mouth daily. (Patient not taking: Reported on 03/11/2020)  . naproxen sodium (ALEVE) 220 MG tablet Take 220 mg by mouth. (Patient not taking: Reported on 03/11/2020)   No facility-administered encounter medications on file as of 03/11/2020.

## 2020-03-12 ENCOUNTER — Telehealth: Payer: Self-pay

## 2020-03-12 ENCOUNTER — Ambulatory Visit
Admission: RE | Admit: 2020-03-12 | Discharge: 2020-03-12 | Disposition: A | Discharge status: Home / Self Care | Payer: 59 | Source: Ambulatory Visit | Attending: Family Medicine | Admitting: Family Medicine

## 2020-03-12 DIAGNOSIS — R109 Unspecified abdominal pain: Secondary | ICD-10-CM | POA: Insufficient documentation

## 2020-03-12 DIAGNOSIS — R1013 Epigastric pain: Secondary | ICD-10-CM | POA: Diagnosis present

## 2020-03-12 LAB — URINE CULTURE
MICRO NUMBER:: 11326694
SPECIMEN QUALITY:: ADEQUATE

## 2020-03-12 MED ORDER — IOHEXOL 300 MG/ML  SOLN
100.0000 mL | Freq: Once | INTRAMUSCULAR | Status: AC | PRN
  Administered 2020-03-12: 11:00:00 100 mL via INTRAVENOUS

## 2020-03-12 MED ORDER — AMOXICILLIN-POT CLAVULANATE 875-125 MG PO TABS: 1.0000 | ORAL_TABLET | Freq: Two times a day (BID) | ORAL | 0 refills | Status: AC

## 2020-03-12 NOTE — Telephone Encounter (Signed)
[place] pt....   [facility] CT called with call report CT scan, results in chart... please advise

## 2020-03-12 NOTE — Addendum Note (Signed)
Addended by: Owens Loffler on: 03/12/2020 01:50 PM   Modules accepted: Orders

## 2020-03-12 NOTE — Telephone Encounter (Signed)
I will address myself.

## 2020-03-15 ENCOUNTER — Encounter: Payer: Self-pay | Admitting: Nurse Practitioner

## 2020-04-02 ENCOUNTER — Encounter: Payer: Self-pay | Admitting: Nurse Practitioner

## 2020-04-02 ENCOUNTER — Ambulatory Visit: Payer: 59 | Admitting: Nurse Practitioner

## 2020-04-02 VITALS — BP 110/74 | HR 72 | Ht 66.0 in | Wt 178.0 lb

## 2020-04-02 DIAGNOSIS — Z1211 Encounter for screening for malignant neoplasm of colon: Secondary | ICD-10-CM | POA: Diagnosis not present

## 2020-04-02 DIAGNOSIS — R109 Unspecified abdominal pain: Secondary | ICD-10-CM

## 2020-04-02 MED ORDER — SUTAB 1479-225-188 MG PO TABS: 1.0000 | ORAL_TABLET | ORAL | 0 refills | Status: DC

## 2020-04-02 NOTE — Progress Notes (Signed)
ASSESSMENT AND PLAN    # 55 yo female with pain located on her left side, just below rib cage. It started in her back as a very intermittent pain 6 months ago. She has localized tenderness of her left side ( not LUQ) just below rib cage. Pain not affected by physical activity nor is it affected by eating or bowel movements. Patient does not seem to be GI related. CT scan and labs unremarkable. Fortunately, for whatever reason she hasn't had any pain since Christmas.   # Colon cancer screening. Patient has never had a colonoscopy. No alarm features such as bleeding or anemia. No [place] of colon cancer. Patient is interested in having a screening colonoscopy.  --Patient will be scheduled for a colonoscopy. The risks and benefits of colonoscopy with possible polypectomy / biopsies were discussed and the patient agrees to proceed.   # Fatty liver by CTscan. Liver chemistries normal. Explained that weight loss is part of management of fatty liver disease   HISTORY OF PRESENT ILLNESS     Primary Gastroenterologist : new - [place] Cellar, MD  Chief Complaint : abdominal pain   Monique Martin is a 55 y.o. female with PMH / [place] significant for,  but not necessarily limited to: endometriosis, C-section x2, pancreatitis, cholecystectomy  Patient saw PCP mid December for left sided abdominal pain. She says the pain started in her left back several months prior and was very intermittent. Then,  in December the pain began radiating from her back around to her left side just below bottom rib. She says the pain did not radiate into the abdominal area but it felt like when she had pancreatitis years ago. The pain was a persistent stabbing pain and she had associated nausea. The pain wasn't related to eating or BMs and it wasn't related to physical activity. She saw PCP who ran tests. Her CBC was fine. Except for minimally elevated ALT of 42 her liver tests were normal. Lipase normal. H.pylori IgG  negative. UA showed small number of WBC, sent for culture.  Treated empirically with Augmentin to cover for UTI and or colitis / diverticulitis.  CT scan w/ contrast on 03/12/20 negative for any acute findings but showed nonobstructing right kidney stone, small hiatal hernia, hepatic steatosis. No abnormalities of the spleen. Urine culture results suggested contamination but she was advised to complete Augmentin. PCP didn't feel pain was musculoskeletal in nature. She hasn't had any further pain since Christmas.   Aleve on home med list but she hasn't taken it in a year as it elevated her blood pressure.   Data Reviewed: December 2021 CBC -ok Except for minimally elevated ALT of 42 her liver tests were normal.  Lipase normal.  H.pylori IgG negative.  UA showed small number of WBC so sent for culture but returned as contaminated.    Previous Endoscopic Evaluations / Pertinent Studies:  none  Past Medical History:  Diagnosis Date  . Anorexia   . Broken arms    both arms  . Heart murmur   . Heartburn   . Lactose intolerance   . Migraines      Past Surgical History:  Procedure Laterality Date  . BREAST BIOPSY Right 10/06/2015  . BREAST BIOPSY    . CESAREAN SECTION  1992&1996   x 2  . CHOLECYSTECTOMY    . LAPAROSCOPY    . TONSILLECTOMY     Family History  Problem Relation Age of Onset  . Diabetes Father   .  Hearing loss Father   . Hyperlipidemia Father   . Hypertension Father   . Cancer Mother 70       bone  . Cancer Paternal Aunt        breast  . Breast cancer Paternal Aunt   . Cancer Paternal Uncle        bone   . Cancer Paternal Uncle        leukemia  . Arthritis Maternal Grandmother   . Hyperlipidemia Maternal Grandmother   . Hypertension Maternal Grandmother   . Stroke Maternal Grandmother   . Heart disease Maternal Grandfather   . Hyperlipidemia Maternal Grandfather   . Hypertension Maternal Grandfather   . Heart attack Maternal Grandfather   . Stroke  Paternal Grandmother   . Hypertension Paternal Grandmother   . Hyperlipidemia Paternal Grandmother   . Heart disease Paternal Grandmother   . Heart attack Paternal Grandmother   . Heart disease Paternal Grandfather   . Hyperlipidemia Paternal Grandfather   . Hypertension Paternal Grandfather   . Heart attack Paternal Grandfather   . Hearing loss Paternal Grandfather    Social History   Tobacco Use  . Smoking status: Never Smoker  . Smokeless tobacco: Never Used  Vaping Use  . Vaping Use: Never used  Substance Use Topics  . Alcohol use: Never  . Drug use: Never   Current Outpatient Medications  Medication Sig Dispense Refill  . Cholecalciferol (VITAMIN D3) 50 MCG (2000 UT) capsule Take 1 capsule (2,000 Units total) by mouth daily. 90 capsule 0  . Cyanocobalamin (B-12) 1000 MCG TABS Take 1 tablet by mouth daily. (Patient not taking: Reported on 03/11/2020) 90 tablet 0  . dextromethorphan-guaiFENesin (MUCINEX DM) 30-600 MG 12hr tablet Take 1 tablet by mouth 2 (two) times daily.    . fluticasone (FLONASE) 50 MCG/ACT nasal spray Place into both nostrils daily.    . naproxen sodium (ALEVE) 220 MG tablet Take 220 mg by mouth. (Patient not taking: Reported on 03/11/2020)     No current facility-administered medications for this visit.   No Known Allergies   Review of Systems: Positive for allergy / sinus trouble, fatigue, night sweats.  All other systems reviewed and negative except where noted in HPI.   PHYSICAL EXAM :    Wt Readings from Last 3 Encounters:  03/11/20 176 lb 6.4 oz (80 kg)  02/15/18 175 lb (79.4 kg)  01/25/18 179 lb 6.4 oz (81.4 kg)    BP 110/74   Pulse 72   Ht 5\' 6"  (1.676 m)   Wt 178 lb (80.7 kg)   BMI 28.73 kg/m  Constitutional:  Pleasant female in no acute distress. Psychiatric: Normal mood and affect. Behavior is normal. EENT: Pupils normal.  Conjunctivae are normal. No scleral icterus. Neck supple.  Cardiovascular: Normal rate, regular rhythm. No  edema Pulmonary/chest: Effort normal and breath sounds normal. No wheezing, rales or rhonchi. Abdominal: Soft, nondistended, nontender. Bowel sounds active throughout. There are no masses palpable. No hepatomegaly. Musculoskeletal: Localized tenderness to left flank area ( just below rib cage).   Neurological: Alert and oriented to person place and time. Skin: Skin is warm and dry. No rashes noted.  Tye Savoy, NP  04/02/2020, 11:33 AM  Cc:  Referring Provider Donella Stade, MD

## 2020-04-02 NOTE — Patient Instructions (Addendum)
If you are age 56 or older, your body mass index should be between 23-30. Your Body mass index is 28.73 kg/m. If this is out of the aforementioned range listed, please consider follow up with your Primary Care Provider.  If you are age 88 or younger, your body mass index should be between 19-25. Your Body mass index is 28.73 kg/m. If this is out of the aformentioned range listed, please consider follow up with your Primary Care Provider.   You have been scheduled for a colonoscopy. Please follow written instructions given to you at your visit today.  Please pick up your prep supplies at the pharmacy within the next 1-3 days. If you use inhalers (even only as needed), please bring them with you on the day of your procedure.  Follow up pending the results of your Colonoscopy or as needed.  Thank you for entrusting me with your care and choosing [facility].  Tye Savoy, NP-C

## 2020-04-02 NOTE — Progress Notes (Signed)
Agree with assessment and plan as outlined.  

## 2020-04-29 ENCOUNTER — Telehealth: Payer: Self-pay | Admitting: Nurse Practitioner

## 2020-04-29 NOTE — Telephone Encounter (Signed)
Called patient's pharmacy, they were able to run the code and get it to work while I was on the phone with them. The patient has been notified and will pick up her prep.

## 2020-05-26 ENCOUNTER — Other Ambulatory Visit: Payer: Self-pay | Admitting: Gastroenterology

## 2020-05-27 LAB — SARS CORONAVIRUS 2 (TAT 6-24 HRS): SARS Coronavirus 2: NEGATIVE

## 2020-05-28 ENCOUNTER — Other Ambulatory Visit: Payer: Self-pay

## 2020-05-28 ENCOUNTER — Encounter: Payer: Self-pay | Admitting: Gastroenterology

## 2020-05-28 ENCOUNTER — Ambulatory Visit (AMBULATORY_SURGERY_CENTER): Payer: 59 | Admitting: Gastroenterology

## 2020-05-28 VITALS — BP 144/81 | HR 66 | Temp 97.3°F | Resp 20 | Ht 66.0 in | Wt 178.0 lb

## 2020-05-28 DIAGNOSIS — D120 Benign neoplasm of cecum: Secondary | ICD-10-CM | POA: Diagnosis not present

## 2020-05-28 DIAGNOSIS — Z1211 Encounter for screening for malignant neoplasm of colon: Secondary | ICD-10-CM | POA: Diagnosis not present

## 2020-05-28 MED ORDER — SODIUM CHLORIDE 0.9 % IV SOLN: 500.0000 mL | Freq: Once | INTRAVENOUS | Status: DC

## 2020-05-28 NOTE — Patient Instructions (Signed)
YOU HAD AN ENDOSCOPIC PROCEDURE TODAY AT THE [facility]:   Refer to the procedure report that was given to you for any specific questions about what was found during the examination.  If the procedure report does not answer your questions, please call your gastroenterologist to clarify.  If you requested that your care partner not be given the details of your procedure findings, then the procedure report has been included in a sealed envelope for you to review at your convenience later.  YOU SHOULD EXPECT: Some feelings of bloating in the abdomen. Passage of more gas than usual.  Walking can help get rid of the air that was put into your GI tract during the procedure and reduce the bloating. If you had a lower endoscopy (such as a colonoscopy or flexible sigmoidoscopy) you may notice spotting of blood in your stool or on the toilet paper. If you underwent a bowel prep for your procedure, you may not have a normal bowel movement for a few days.  Please Note:  You might notice some irritation and congestion in your nose or some drainage.  This is from the oxygen used during your procedure.  There is no need for concern and it should clear up in a day or so.  SYMPTOMS TO REPORT IMMEDIATELY:   Following lower endoscopy (colonoscopy or flexible sigmoidoscopy):  Excessive amounts of blood in the stool  Significant tenderness or worsening of abdominal pains  Swelling of the abdomen that is new, acute  Fever of 100F or higher  For urgent or emergent issues, a gastroenterologist can be reached at any hour by calling [phone]. Do not use MyChart messaging for urgent concerns.    DIET:  We do recommend a small meal at first, but then you may proceed to your regular diet.  Drink plenty of fluids but you should avoid alcoholic beverages for 24 hours.  ACTIVITY:  You should plan to take it easy for the rest of today and you should NOT DRIVE or use heavy machinery until tomorrow (because  of the sedation medicines used during the test).    FOLLOW UP: Our staff will call the number listed on your records 48-72 hours following your procedure to check on you and address any questions or concerns that you may have regarding the information given to you following your procedure. If we do not reach you, we will leave a message.  We will attempt to reach you two times.  During this call, we will ask if you have developed any symptoms of COVID 19. If you develop any symptoms (ie: fever, flu-like symptoms, shortness of breath, cough etc.) before then, please call [phone].  If you test positive for Covid 19 in the 2 weeks post procedure, please call and report this information to us.    If any biopsies were taken you will be contacted by phone or by letter within the next 1-3 weeks.  Please call us at [phone] if you have not heard about the biopsies in 3 weeks.    SIGNATURES/CONFIDENTIALITY: You and/or your care partner have signed paperwork which will be entered into your electronic medical record.  These signatures attest to the fact that that the information above on your After Visit Summary has been reviewed and is understood.  Full responsibility of the confidentiality of this discharge information lies with you and/or your care-partner. 

## 2020-05-28 NOTE — Progress Notes (Signed)
Medical history reviewed with no changes noted. VS assessed by C.W 

## 2020-05-28 NOTE — Progress Notes (Signed)
Called to room to assist during endoscopic procedure.  Patient ID and intended procedure confirmed with present staff. Received instructions for my participation in the procedure from the performing physician.  

## 2020-05-28 NOTE — Progress Notes (Signed)
pt tolerated well. VSS. awake and to recovery. Report given to RN.  

## 2020-05-28 NOTE — Op Note (Signed)
[facility] Patient Name: Monique Martin Procedure Date: 05/28/2020 7:22 AM MRN: [id number] Endoscopist: Remo Lipps P. Havery [name] , MD Age: 55 Referring MD:  Date of Birth: 01-18-1966 Gender: Female Account #: [id number] Procedure:                Colonoscopy Indications:              Screening for colorectal malignant neoplasm, This                            is the patient's first colonoscopy Medicines:                Monitored Anesthesia Care Procedure:                Pre-Anesthesia Assessment:                           - Prior to the procedure, a History and Physical                            was performed, and patient medications and                            allergies were reviewed. The patient's tolerance of                            previous anesthesia was also reviewed. The risks                            and benefits of the procedure and the sedation                            options and risks were discussed with the patient.                            All questions were answered, and informed consent                            was obtained. Prior Anticoagulants: The patient has                            taken no previous anticoagulant or antiplatelet                            agents. ASA Grade Assessment: II - A patient with                            mild systemic disease. After reviewing the risks                            and benefits, the patient was deemed in                            satisfactory condition to undergo the procedure.  After obtaining informed consent, the colonoscope                            was passed under direct vision. Throughout the                            procedure, the patient's blood pressure, pulse, and                            oxygen saturations were monitored continuously. The                            Olympus PCF-H190DL [id number]) Colonoscope was                            introduced through the  anus and advanced to the the                            cecum, identified by appendiceal orifice and                            ileocecal valve. The colonoscopy was performed                            without difficulty. The patient tolerated the                            procedure well. The quality of the bowel                            preparation was good. The ileocecal valve,                            appendiceal orifice, and rectum were photographed. Scope In: 8:03:19 AM Scope Out: 8:22:01 AM Scope Withdrawal Time: 0 hours 14 minutes 56 seconds  Total Procedure Duration: 0 hours 18 minutes 42 seconds  Findings:                 The perianal and digital rectal examinations were                            normal.                           Two sessile polyps were found in the cecum. The                            polyps were 2 to 3 mm in size. These polyps were                            removed with a cold snare. Resection and retrieval                            were complete.  A few small-mouthed diverticula were found in the                            sigmoid colon.                           The exam was otherwise normal throughout the                            examined colon. Complications:            No immediate complications. Estimated blood loss:                            Minimal. Estimated Blood Loss:     Estimated blood loss was minimal. Impression:               - Two 2 to 3 mm polyps in the cecum, removed with a                            cold snare. Resected and retrieved.                           - Diverticulosis in the sigmoid colon. Recommendation:           - Patient has a contact number available for                            emergencies. The signs and symptoms of potential                            delayed complications were discussed with the                            patient. Return to normal activities tomorrow.                             Written discharge instructions were provided to the                            patient.                           - Resume previous diet.                           - Continue present medications.                           - Await pathology results. Remo Lipps P. Havery [name], MD 05/28/2020 8:26:32 AM This report has been signed electronically.

## 2020-06-01 ENCOUNTER — Telehealth: Payer: Self-pay

## 2020-06-01 NOTE — Telephone Encounter (Signed)
  Follow up Call-  Call back number 05/28/2020  Post procedure Call Back phone  # [phone]  Permission to leave phone message Yes  Some recent data might be hidden     Patient questions:  Do you have a fever, pain , or abdominal swelling? No. Pain Score  0 *  Have you tolerated food without any problems? Yes.    Have you been able to return to your normal activities? Yes.    Do you have any questions about your discharge instructions: Diet   No. Medications  No. Follow up visit  No.  Do you have questions or concerns about your Care? No.  Actions: * If pain score is 4 or above: No action needed, pain <4. 1. Have you developed a fever since your procedure? nop  2.   Have you had an respiratory symptoms (SOB or cough) since your procedure? no  3.   Have you tested positive for COVID 19 since your procedure no  4.   Have you had any family members/close contacts diagnosed with the COVID 19 since your procedure?  no   If yes to any of these questions please route to Joylene John, RN and Joella Prince, RN

## 2021-06-13 ENCOUNTER — Other Ambulatory Visit: Payer: Self-pay | Admitting: Obstetrics and Gynecology

## 2021-06-13 DIAGNOSIS — Z1231 Encounter for screening mammogram for malignant neoplasm of breast: Secondary | ICD-10-CM

## 2021-06-22 ENCOUNTER — Ambulatory Visit
Admission: RE | Admit: 2021-06-22 | Discharge: 2021-06-22 | Disposition: A | Discharge status: Home / Self Care | Payer: 59 | Source: Ambulatory Visit | Attending: Obstetrics and Gynecology | Admitting: Obstetrics and Gynecology

## 2021-06-22 DIAGNOSIS — Z1231 Encounter for screening mammogram for malignant neoplasm of breast: Secondary | ICD-10-CM

## 2022-06-06 ENCOUNTER — Other Ambulatory Visit: Payer: Self-pay | Admitting: Obstetrics and Gynecology

## 2022-06-06 DIAGNOSIS — Z1231 Encounter for screening mammogram for malignant neoplasm of breast: Secondary | ICD-10-CM

## 2022-07-20 ENCOUNTER — Ambulatory Visit: Payer: 59

## 2022-07-21 ENCOUNTER — Ambulatory Visit
Admission: RE | Admit: 2022-07-21 | Discharge: 2022-07-21 | Disposition: A | Discharge status: Home / Self Care | Payer: 59 | Source: Ambulatory Visit | Attending: Obstetrics and Gynecology | Admitting: Obstetrics and Gynecology

## 2022-07-21 DIAGNOSIS — Z1231 Encounter for screening mammogram for malignant neoplasm of breast: Secondary | ICD-10-CM

## 2022-07-31 ENCOUNTER — Other Ambulatory Visit: Payer: Self-pay | Admitting: Obstetrics and Gynecology

## 2022-07-31 DIAGNOSIS — E2839 Other primary ovarian failure: Secondary | ICD-10-CM

## 2023-02-23 ENCOUNTER — Other Ambulatory Visit: Payer: 59

## 2023-03-05 ENCOUNTER — Other Ambulatory Visit: Payer: Self-pay | Admitting: Obstetrics and Gynecology

## 2023-03-05 DIAGNOSIS — E2839 Other primary ovarian failure: Secondary | ICD-10-CM

## 2023-03-23 ENCOUNTER — Other Ambulatory Visit: Payer: 59

## 2023-07-23 ENCOUNTER — Other Ambulatory Visit: Payer: Self-pay | Admitting: Obstetrics and Gynecology

## 2023-07-23 DIAGNOSIS — Z1231 Encounter for screening mammogram for malignant neoplasm of breast: Secondary | ICD-10-CM

## 2023-08-03 ENCOUNTER — Encounter (HOSPITAL_COMMUNITY): Payer: Self-pay

## 2023-08-10 ENCOUNTER — Ambulatory Visit
Admission: RE | Admit: 2023-08-10 | Discharge: 2023-08-10 | Disposition: A | Discharge status: Home / Self Care | Source: Ambulatory Visit | Attending: Obstetrics and Gynecology | Admitting: Obstetrics and Gynecology

## 2023-08-10 DIAGNOSIS — Z1231 Encounter for screening mammogram for malignant neoplasm of breast: Secondary | ICD-10-CM

## 2023-10-25 ENCOUNTER — Other Ambulatory Visit: Payer: 59

## 2023-11-13 ENCOUNTER — Other Ambulatory Visit (HOSPITAL_BASED_OUTPATIENT_CLINIC_OR_DEPARTMENT_OTHER): Payer: Self-pay | Admitting: Obstetrics and Gynecology

## 2023-11-13 DIAGNOSIS — N959 Unspecified menopausal and perimenopausal disorder: Secondary | ICD-10-CM
